# Patient Record
Sex: Male | Born: 1960 | Race: White | Hispanic: No | Marital: Single | Smoking: Current every day smoker
Health system: Southern US, Community
[De-identification: ages and names within clinical notes are randomized; demographics above are authoritative.]

## PROBLEM LIST (undated history)

## (undated) DIAGNOSIS — J439 Emphysema, unspecified: Secondary | ICD-10-CM

## (undated) DIAGNOSIS — W3400XA Accidental discharge from unspecified firearms or gun, initial encounter: Secondary | ICD-10-CM

## (undated) DIAGNOSIS — G8929 Other chronic pain: Secondary | ICD-10-CM

## (undated) DIAGNOSIS — R52 Pain, unspecified: Secondary | ICD-10-CM

## (undated) DIAGNOSIS — F319 Bipolar disorder, unspecified: Secondary | ICD-10-CM

## (undated) DIAGNOSIS — M549 Dorsalgia, unspecified: Secondary | ICD-10-CM

## (undated) HISTORY — PX: BACK SURGERY: SHX140

## (undated) HISTORY — PX: MANDIBLE SURGERY: SHX707

## (undated) HISTORY — PX: DG THUMB LEFT HAND: HXRAD1658

## (undated) HISTORY — DX: Emphysema, unspecified: J43.9

---

## 2009-03-12 ENCOUNTER — Inpatient Hospital Stay: Payer: Self-pay | Admitting: Unknown Physician Specialty

## 2011-03-12 ENCOUNTER — Emergency Department (HOSPITAL_COMMUNITY)
Admission: EM | Admit: 2011-03-12 | Discharge: 2011-03-12 | Disposition: A | Discharge status: Home / Self Care | Payer: Medicaid Other | Attending: Emergency Medicine | Admitting: Emergency Medicine

## 2011-03-12 ENCOUNTER — Encounter: Payer: Self-pay | Admitting: *Deleted

## 2011-03-12 DIAGNOSIS — M545 Low back pain, unspecified: Secondary | ICD-10-CM | POA: Insufficient documentation

## 2011-03-12 DIAGNOSIS — G8929 Other chronic pain: Secondary | ICD-10-CM | POA: Insufficient documentation

## 2011-03-12 DIAGNOSIS — F17200 Nicotine dependence, unspecified, uncomplicated: Secondary | ICD-10-CM | POA: Insufficient documentation

## 2011-03-12 MED ORDER — HYDROCODONE-ACETAMINOPHEN 7.5-325 MG PO TABS: 1.0000 | ORAL_TABLET | ORAL | Status: AC | PRN

## 2011-03-12 MED ORDER — HYDROCODONE-ACETAMINOPHEN 5-325 MG PO TABS
2.0000 | ORAL_TABLET | Freq: Once | ORAL | Status: AC
  Administered 2011-03-12: 2 via ORAL
  Filled 2011-03-12: qty 2

## 2011-03-12 MED ORDER — DEXAMETHASONE SODIUM PHOSPHATE 4 MG/ML IJ SOLN
8.0000 mg | Freq: Once | INTRAMUSCULAR | Status: AC
  Administered 2011-03-12: 8 mg via INTRAMUSCULAR

## 2011-03-12 MED ORDER — DEXAMETHASONE SODIUM PHOSPHATE 4 MG/ML IJ SOLN
8.0000 mg | Freq: Once | INTRAMUSCULAR | Status: DC
  Filled 2011-03-12: qty 2

## 2011-03-12 MED ORDER — DIAZEPAM 5 MG PO TABS
5.0000 mg | ORAL_TABLET | Freq: Once | ORAL | Status: AC
  Administered 2011-03-12: 5 mg via ORAL
  Filled 2011-03-12: qty 1

## 2011-03-12 MED ORDER — DEXAMETHASONE 6 MG PO TABS: 6.0000 mg | ORAL_TABLET | Freq: Two times a day (BID) | ORAL | Status: AC

## 2011-03-12 MED ORDER — METHOCARBAMOL 500 MG PO TABS: ORAL_TABLET | ORAL | Status: DC

## 2011-03-12 NOTE — ED Notes (Signed)
Pt c/o chronic back pain with pain to both sides of his butt and down left leg.

## 2011-03-12 NOTE — ED Notes (Signed)
Pt c/o lower back pain, hx of chronic back pain from back injury at work several years ago, pt's pcp is no longer practicing so pt decided to move to this area to help his sister, pt has been in this area for a week, states that with the move his lower back has "flared up", does not have local pcp and is requesting referral for pcp as well, states that he was seen in [place] er about 8 days ago and given dilaudid pain shot and oxycodone prescription, states that he normally takes endocets 10/325 3-4 a day, states that he would get 120 a month.

## 2011-03-12 NOTE — ED Provider Notes (Signed)
History     CSN: [id number] Arrival date & time: 03/12/2011  5:55 PM  Chief Complaint  Patient presents with  . Back Pain  . pt c/o pain in both sides of butt    HPI Comments: Pt complain of pain and burning from the lower back to both [place]tox and down the left leg. This is not new. Pt sustained injury from a fall at work 6 years ago. He has been seen by a pain specialist in another area. He has recently moved to the [place] area and request assistance with pain and referrals.  Patient is a 50 y.o. male presenting with back pain. The history is provided by the patient.  Back Pain  This is a chronic problem. The problem occurs daily. The problem has not changed since onset.The pain is associated with falling. The pain is present in the lumbar spine. The quality of the pain is described as aching. The pain radiates to the left thigh. The pain is severe. The symptoms are aggravated by certain positions. Pertinent negatives include no chest pain, no abdominal pain, no bowel incontinence, no bladder incontinence and no dysuria. The treatment provided no relief.    History reviewed. No pertinent past medical history.  Past Surgical History  Procedure Date  . Back surgery     History reviewed. No pertinent family history.  History  Substance Use Topics  . Smoking status: Current Everyday Smoker -- 1.0 packs/day  . Smokeless tobacco: Not on file  . Alcohol Use: No      Review of Systems  Constitutional: Negative for activity change.       All ROS Neg except as noted in HPI  HENT: Negative for nosebleeds and neck pain.   Eyes: Negative for photophobia and discharge.  Respiratory: Negative for cough, shortness of breath and wheezing.   Cardiovascular: Negative for chest pain and palpitations.  Gastrointestinal: Negative for abdominal pain, blood in stool and bowel incontinence.  Genitourinary: Negative for bladder incontinence, dysuria, frequency and hematuria.  Musculoskeletal:  Positive for back pain. Negative for arthralgias.  Skin: Negative.   Neurological: Negative for dizziness, seizures and speech difficulty.  Psychiatric/Behavioral: Negative for hallucinations and confusion.    Physical Exam  BP 131/94  Pulse 72  Temp(Src) 98 F (36.7 C) (Oral)  Resp 16  Ht 6\' 1"  (1.854 m)  Wt 155 lb (70.308 kg)  BMI 20.45 kg/m2  SpO2 100%  Physical Exam  Nursing note and vitals reviewed. Constitutional: He is oriented to person, place, and time. He appears well-developed and well-nourished.  Non-toxic appearance.  HENT:  Head: Normocephalic.  Right Ear: Tympanic membrane and external ear normal.  Left Ear: Tympanic membrane and external ear normal.  Eyes: EOM and lids are normal. Pupils are equal, round, and reactive to light.  Neck: Normal range of motion. Neck supple. Carotid bruit is not present.  Cardiovascular: Normal rate, regular rhythm, normal heart sounds, intact distal pulses and normal pulses.   Pulmonary/Chest: Breath sounds normal. No respiratory distress.       Course breath sounds.  Abdominal: Soft. Bowel sounds are normal. There is no tenderness. There is no guarding.  Musculoskeletal: Normal range of motion.       Lower back pain with change of position.  Lymphadenopathy:       Head (right side): No submandibular adenopathy present.       Head (left side): No submandibular adenopathy present.    He has no cervical adenopathy.  Neurological: He is  alert and oriented to person, place, and time. He has normal strength. No cranial nerve deficit or sensory deficit. He exhibits normal muscle tone. Coordination normal.  Skin: Skin is warm and dry.  Psychiatric: He has a normal mood and affect. His speech is normal.    ED Course: Discussed need for referal to the [facility]. PCP can be referred by Medicaid.  Procedures  MDM I have reviewed nursing notes, vital signs, and all appropriate lab and imaging results for this  patient.      Kathie Dike, [place] 03/12/11 [phone]

## 2011-03-12 NOTE — ED Notes (Signed)
Pt a/ox4. Resp even and unlabored. D/C instructions reviewed with pt. Pt verbalized understanding. Pt ambulated to POV with steady gate. 

## 2011-03-12 NOTE — ED Provider Notes (Signed)
Medical screening examination/treatment/procedure(s) were performed by non-physician practitioner and as supervising physician I was immediately available for consultation/collaboration.   Lyanne Co, MD 03/12/11 [phone]

## 2011-05-13 ENCOUNTER — Ambulatory Visit: Payer: Medicaid Other | Admitting: Internal Medicine

## 2012-06-10 ENCOUNTER — Inpatient Hospital Stay: Payer: Self-pay | Admitting: Psychiatry

## 2012-06-10 LAB — URINALYSIS, COMPLETE
Bacteria: NONE SEEN
Bilirubin,UR: NEGATIVE
Glucose,UR: NEGATIVE mg/dL (ref 0–75)
Ketone: NEGATIVE
Nitrite: NEGATIVE
Specific Gravity: 1.01 (ref 1.003–1.030)

## 2012-06-10 LAB — DRUG SCREEN, URINE
Barbiturates, Ur Screen: NEGATIVE (ref ?–200)
Cannabinoid 50 Ng, Ur ~~LOC~~: NEGATIVE (ref ?–50)
Cocaine Metabolite,Ur ~~LOC~~: NEGATIVE (ref ?–300)
Methadone, Ur Screen: NEGATIVE (ref ?–300)
Opiate, Ur Screen: POSITIVE (ref ?–300)
Phencyclidine (PCP) Ur S: NEGATIVE (ref ?–25)
Tricyclic, Ur Screen: NEGATIVE (ref ?–1000)

## 2012-06-10 LAB — CBC
HCT: 45 % (ref 40.0–52.0)
HGB: 15.1 g/dL (ref 13.0–18.0)
MCH: 30.4 pg (ref 26.0–34.0)
MCHC: 33.4 g/dL (ref 32.0–36.0)
MCV: 91 fL (ref 80–100)

## 2012-06-10 LAB — COMPREHENSIVE METABOLIC PANEL
Anion Gap: 6 — ABNORMAL LOW (ref 7–16)
BUN: 10 mg/dL (ref 7–18)
Chloride: 107 mmol/L (ref 98–107)
Creatinine: 1.05 mg/dL (ref 0.60–1.30)
EGFR (Non-African Amer.): >60
Potassium: 3.7 mmol/L (ref 3.5–5.1)
SGPT (ALT): 38 U/L (ref 12–78)

## 2012-06-10 LAB — ACETAMINOPHEN LEVEL: Acetaminophen: <2 ug/mL

## 2012-06-10 LAB — TSH: Thyroid Stimulating Horm: 3.25 u[IU]/mL

## 2014-07-09 DIAGNOSIS — F17200 Nicotine dependence, unspecified, uncomplicated: Secondary | ICD-10-CM | POA: Insufficient documentation

## 2014-07-09 DIAGNOSIS — F1021 Alcohol dependence, in remission: Secondary | ICD-10-CM | POA: Insufficient documentation

## 2014-10-24 NOTE — Discharge Summary (Signed)
PATIENT NAME:  Jonathan Salazar, Jonathan Salazar MR#:  [id number] DATE OF BIRTH:  11-Jun-1961  DATE OF ADMISSION:  06/10/2012 DATE OF DISCHARGE:  06/13/2012  HOSPITAL COURSE: See dictated history and physical for details of admission. This 54 year old man with a long history of mood instability and anger problems as well as chronic pain came to the Emergency Room making suicidal statements. It was not clear what if anything had changed that made him feel so much worse. He gives a history of having long-standing problems with anger and rage as well as depression. He was admitted voluntarily to the hospital. In the hospital, he was initially withdrawn from his narcotic pain medicine because of his unclear history of reason for needing such high narcotic medication. I did, however, after speaking with the patient speak with his outpatient chronic pain doctor who confirms that the patient will continue to receive standing narcotics through the orthopedic clinic in [place]. Given this the patient was continued on his Percocet 10 mg strength five of them a day that he was taking outpatient. He was also continued on 1.5 mg of clonazepam that apparently Dr. Janeece Riggers continues to prescribe for him. Despite being back on these medicines patient continued to complain of mood lability. I suggested to him that with his history of chronic anger, depression and mood lability and difficulty sleeping, that we try Seroquel. He was agreeable and has been dosed with Seroquel initially 100 mg at night and then 200 mg at night. It has helped him sleep a little bit and perhaps has been of some help with his mood but he still continues to be impulsive, angry, and depressed. Patient signed a 72 hour release form which will reach its full term as of this evening. I spoke with the patient today and told him that my advice would be that he cancel that request and stay in the hospital longer. I informed him that I would like to try to get the Seroquel up to a  reasonable dose and see if we could help him with his anger and irritability. Patient expressed an understanding of that but continues to insist that he wants to be discharged from the hospital. He has been educated about the increased risk of suicide, violence, harm to others and generally poor outcomes including further legal and social problems if he does not get his anger and depression under control. He was educated that medication and therapy could be of help if he were to be compliant with them and make an effort to try and improve this. He expresses an understanding of all of this but still says that he wants to be discharged today. He can go back to stay at his sisters. He says that he will follow up with Dr. Janeece Riggers. Patient absolutely denies that he is suicidal currently and has not attempted to harm himself in the hospital. He has expressed optimism about getting his disability and has things to live for including trying to get himself living independently once he gets his disability. He denies any wish to die. He also has not been physically violent towards others in the hospital and denies any actual intention of doing anything violent or aggressive to others. At this point, the patient appears to be able to make informed decisions about his treatment. He does not meet acute commitment criteria. Because of this he will be discharged although it is against my advice. He will not be given any new prescriptions. He needs to follow up with his pain  doctor and also follow up with Dr. Janeece RiggersSu. He was given very strong advice that he do that at the earliest opportunity. He is aware that he can return to the Emergency Room if things become dangerous or he needs emergency treatment again.   DISCHARGE MEDICATIONS: No new prescriptions will be given. Patient will continue on:  1. Percocet 10 mg strength five a day through his primary care pain doctor. 2. Gabapentin 600 mg 3 times a day through his pain  doctor. 3. Klonopin 1.5 mg at bedtime through Dr. Janeece RiggersSu. 4. Flexeril 10 mg q.8 hours p.r.n. through his pain doctor.   LABORATORY, DIAGNOSTIC AND RADIOLOGICAL DATA: EKG was entirely normal. Chemistry panel on admission normal. Alcohol undetected. CBC normal. TSH normal. Drug screen positive for opiates. Urinalysis unremarkable. Head CT normal. Chest x-ray normal.   MENTAL STATUS EXAM: Somewhat disheveled man who looks his stated age. Cooperative with the interview. Good eye contact. Psychomotor activity normal. Affect still irritable and dysphoric but reactive. Mood stated as being okay. Thoughts tend to focus strongly on his grievances of things that have happened in the past but are not grossly loose or disorganized and he is able to hold a lucid conversation. Denies auditory or visual hallucinations. Denies suicidal or homicidal ideation. Judgment and insight perhaps not up to the highest standard but he does appear to be able to make informed decisions. Baseline intelligence average. Alert and oriented x4.   DIAGNOSIS PRINCIPLE AND PRIMARY:  AXIS I: Depression, not otherwise specified, rule out bipolar disorder, not otherwise specified.   SECONDARY DIAGNOSES:  AXIS I: Deferred.   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   AXIS III: Chronic pain.   AXIS IV: Moderate to severe from minimal social support, no financial support to speak of.     AXIS V: Functioning at time of discharge 50.  ____________________________ Jonathan AmelJohn T. Harding Thomure, MD jtc:cms D: 06/13/2012 12:41:20 ET T: 06/13/2012 13:12:51 ET JOB#: [id number]  cc: Jonathan AmelJohn T. Jadie Allington, MD, <Dictator> Jonathan AmelJOHN T Sallee Hogrefe MD ELECTRONICALLY SIGNED 06/14/2012 9:45

## 2014-10-24 NOTE — H&P (Signed)
  Audery AmelJOHN T Loeta Herst MD ELECTRONICALLY SIGNED 06/14/2012 9:45
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
## 2016-09-22 ENCOUNTER — Emergency Department (HOSPITAL_COMMUNITY)
Admission: EM | Admit: 2016-09-22 | Discharge: 2016-09-22 | Disposition: A | Discharge status: Home / Self Care | Payer: Medicaid Other | Attending: Emergency Medicine | Admitting: Emergency Medicine

## 2016-09-22 ENCOUNTER — Encounter (HOSPITAL_COMMUNITY): Payer: Self-pay

## 2016-09-22 DIAGNOSIS — F17210 Nicotine dependence, cigarettes, uncomplicated: Secondary | ICD-10-CM | POA: Insufficient documentation

## 2016-09-22 DIAGNOSIS — M5441 Lumbago with sciatica, right side: Secondary | ICD-10-CM | POA: Diagnosis not present

## 2016-09-22 DIAGNOSIS — G8929 Other chronic pain: Secondary | ICD-10-CM | POA: Insufficient documentation

## 2016-09-22 DIAGNOSIS — F1193 Opioid use, unspecified with withdrawal: Secondary | ICD-10-CM

## 2016-09-22 DIAGNOSIS — M5442 Lumbago with sciatica, left side: Secondary | ICD-10-CM | POA: Insufficient documentation

## 2016-09-22 DIAGNOSIS — M545 Low back pain: Secondary | ICD-10-CM | POA: Diagnosis present

## 2016-09-22 DIAGNOSIS — F1123 Opioid dependence with withdrawal: Secondary | ICD-10-CM | POA: Diagnosis not present

## 2016-09-22 HISTORY — DX: Bipolar disorder, unspecified: F31.9

## 2016-09-22 HISTORY — DX: Accidental discharge from unspecified firearms or gun, initial encounter: W34.00XA

## 2016-09-22 HISTORY — DX: Dorsalgia, unspecified: M54.9

## 2016-09-22 HISTORY — DX: Other chronic pain: G89.29

## 2016-09-22 MED ORDER — HYDROMORPHONE HCL 2 MG/ML IJ SOLN
2.0000 mg | Freq: Once | INTRAMUSCULAR | Status: AC
  Administered 2016-09-22: 2 mg via INTRAMUSCULAR
  Filled 2016-09-22: qty 1

## 2016-09-22 MED ORDER — OXYCODONE-ACETAMINOPHEN 5-325 MG PO TABS: 2.0000 | ORAL_TABLET | Freq: Four times a day (QID) | ORAL | 0 refills | Status: DC | PRN

## 2016-09-22 MED ORDER — PREDNISONE 50 MG PO TABS
60.0000 mg | ORAL_TABLET | Freq: Once | ORAL | Status: AC
  Administered 2016-09-22: 60 mg via ORAL
  Filled 2016-09-22: qty 1

## 2016-09-22 MED ORDER — PREDNISONE 10 MG PO TABS
ORAL_TABLET | ORAL | 0 refills | Status: DC
Start: 2016-09-23 — End: 2017-04-07

## 2016-09-22 NOTE — ED Notes (Signed)
Pt upset, loud wanting something for chronic back pain. Pt states his pain MD for the past 6 years has stopped seeing him. Pt informed limited to what can be done in the ER. & he was going to have to establish another pain MD for long term care.

## 2016-09-22 NOTE — Discharge Instructions (Signed)
Take your next dose of prednisone tomorrow evening.  Use the the other medicines as directed.  Do not drive within 4 hours of taking oxycodone as this will make you drowsy.  Avoid lifting,  Bending,  Twisting or any other activity that worsens your pain over the next week.    You should get rechecked if your symptoms are not better over the next 5 days,  Or you develop increased pain,  Weakness in your leg(s) or loss of bladder or bowel function - these are symptoms of a worse injury.
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
## 2016-09-22 NOTE — ED Notes (Signed)
Pt alert & oriented x4, stable gait. Patient given discharge instructions, paperwork & prescription(s). Patient  instructed to stop at the registration desk to finish any additional paperwork. Patient verbalized understanding. Pt left department w/ no further questions. 

## 2016-11-22 ENCOUNTER — Encounter (HOSPITAL_COMMUNITY): Payer: Self-pay | Admitting: *Deleted

## 2016-11-22 ENCOUNTER — Emergency Department (HOSPITAL_COMMUNITY)
Admission: EM | Admit: 2016-11-22 | Discharge: 2016-11-22 | Disposition: A | Discharge status: Home / Self Care | Payer: Medicaid Other | Attending: Emergency Medicine | Admitting: Emergency Medicine

## 2016-11-22 DIAGNOSIS — G8929 Other chronic pain: Secondary | ICD-10-CM | POA: Insufficient documentation

## 2016-11-22 DIAGNOSIS — M5442 Lumbago with sciatica, left side: Secondary | ICD-10-CM | POA: Insufficient documentation

## 2016-11-22 DIAGNOSIS — F17210 Nicotine dependence, cigarettes, uncomplicated: Secondary | ICD-10-CM | POA: Diagnosis not present

## 2016-11-22 DIAGNOSIS — M5441 Lumbago with sciatica, right side: Secondary | ICD-10-CM | POA: Diagnosis not present

## 2016-11-22 DIAGNOSIS — M545 Low back pain: Secondary | ICD-10-CM | POA: Diagnosis present

## 2016-11-22 MED ORDER — KETOROLAC TROMETHAMINE 60 MG/2ML IM SOLN
30.0000 mg | Freq: Once | INTRAMUSCULAR | Status: AC
  Administered 2016-11-22: 30 mg via INTRAMUSCULAR
  Filled 2016-11-22: qty 2

## 2016-11-22 NOTE — ED Triage Notes (Signed)
Low back pain that radiates down both legs.

## 2016-11-22 NOTE — Discharge Instructions (Signed)
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
## 2017-01-16 ENCOUNTER — Emergency Department (HOSPITAL_COMMUNITY): Payer: Medicaid Other

## 2017-01-16 ENCOUNTER — Encounter (HOSPITAL_COMMUNITY): Payer: Self-pay | Admitting: Emergency Medicine

## 2017-01-16 ENCOUNTER — Emergency Department (HOSPITAL_COMMUNITY)
Admission: EM | Admit: 2017-01-16 | Discharge: 2017-01-16 | Disposition: A | Discharge status: Home / Self Care | Payer: Medicaid Other | Attending: Emergency Medicine | Admitting: Emergency Medicine

## 2017-01-16 DIAGNOSIS — W19XXXA Unspecified fall, initial encounter: Secondary | ICD-10-CM | POA: Diagnosis not present

## 2017-01-16 DIAGNOSIS — G8929 Other chronic pain: Secondary | ICD-10-CM

## 2017-01-16 DIAGNOSIS — M5440 Lumbago with sciatica, unspecified side: Secondary | ICD-10-CM | POA: Diagnosis not present

## 2017-01-16 DIAGNOSIS — Y939 Activity, unspecified: Secondary | ICD-10-CM | POA: Insufficient documentation

## 2017-01-16 DIAGNOSIS — Y999 Unspecified external cause status: Secondary | ICD-10-CM | POA: Diagnosis not present

## 2017-01-16 DIAGNOSIS — M545 Low back pain: Secondary | ICD-10-CM | POA: Diagnosis present

## 2017-01-16 DIAGNOSIS — Y929 Unspecified place or not applicable: Secondary | ICD-10-CM | POA: Insufficient documentation

## 2017-01-16 DIAGNOSIS — F17210 Nicotine dependence, cigarettes, uncomplicated: Secondary | ICD-10-CM | POA: Diagnosis not present

## 2017-01-16 HISTORY — DX: Pain, unspecified: R52

## 2017-01-16 MED ORDER — METHYLPREDNISOLONE SODIUM SUCC 125 MG IJ SOLR
125.0000 mg | Freq: Once | INTRAMUSCULAR | Status: AC
  Administered 2017-01-16: 125 mg via INTRAMUSCULAR
  Filled 2017-01-16: qty 2

## 2017-01-16 MED ORDER — ONDANSETRON HCL 4 MG PO TABS
4.0000 mg | ORAL_TABLET | Freq: Once | ORAL | Status: AC
  Administered 2017-01-16: 4 mg via ORAL
  Filled 2017-01-16: qty 1

## 2017-01-16 MED ORDER — MORPHINE SULFATE (PF) 4 MG/ML IV SOLN
4.0000 mg | Freq: Once | INTRAVENOUS | Status: AC
  Administered 2017-01-16: 4 mg via INTRAMUSCULAR
  Filled 2017-01-16: qty 1

## 2017-01-16 MED ORDER — DIAZEPAM 5 MG PO TABS
10.0000 mg | ORAL_TABLET | Freq: Once | ORAL | Status: AC
  Administered 2017-01-16: 10 mg via ORAL
  Filled 2017-01-16: qty 2
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
## 2017-01-16 NOTE — Discharge Instructions (Signed)
Your vital signs within normal limits. Your oxygen level is 99% on room air. No acute neurologic deficit appreciated on examination at this time. You were treated tonight with a narcotic pain medication and muscle relaxer as well as a steroid. Please use caution getting around. Please see the physicians at the [facility], and or one of the pain management centers that you were given by Dr. Pernell DupreAdams for any additional pain management needs. The emergency department is not set up to assist with pain management.

## 2017-01-16 NOTE — ED Notes (Signed)
Pt appears very agitated and upset.  He states that he falls "all the time" and is in severe pain all the time.  He states that he falls when he takes showers, he can't do anything for himself without falling and is in severe pain.  He states that the pharmacy he was using began charging him $300 a month for his meds and he became very upset with them and when he voiced that frustration to the office of his prescribing MD and asked for him to send the prescription to another pharmacy he was discharged from the practice.  Pt also states that he can't do a pain clinic because he can't wait for 4 hours sitting in a chair due to the pain.  Pt states that he hardly sleeps.  Pt reports this is an existing condition from when he broke his back.  Pt states that he is here "for a shot to stop the burning"

## 2017-01-27 DIAGNOSIS — M5442 Lumbago with sciatica, left side: Secondary | ICD-10-CM

## 2017-01-27 DIAGNOSIS — G8929 Other chronic pain: Secondary | ICD-10-CM | POA: Insufficient documentation

## 2017-01-27 DIAGNOSIS — M5441 Lumbago with sciatica, right side: Secondary | ICD-10-CM

## 2017-04-07 ENCOUNTER — Encounter: Payer: Self-pay | Admitting: Nurse Practitioner

## 2017-04-07 ENCOUNTER — Ambulatory Visit
Admission: RE | Admit: 2017-04-07 | Discharge: 2017-04-07 | Disposition: A | Discharge status: Home / Self Care | Payer: Medicaid Other | Source: Ambulatory Visit | Attending: Nurse Practitioner | Admitting: Nurse Practitioner

## 2017-04-07 ENCOUNTER — Ambulatory Visit: Payer: Medicaid Other | Attending: Nurse Practitioner | Admitting: Nurse Practitioner

## 2017-04-07 VITALS — BP 113/77 | HR 66 | Temp 97.8°F | Resp 18 | Ht 73.0 in | Wt 172.0 lb

## 2017-04-07 DIAGNOSIS — M5441 Lumbago with sciatica, right side: Secondary | ICD-10-CM

## 2017-04-07 DIAGNOSIS — M79604 Pain in right leg: Secondary | ICD-10-CM

## 2017-04-07 DIAGNOSIS — F17210 Nicotine dependence, cigarettes, uncomplicated: Secondary | ICD-10-CM | POA: Diagnosis not present

## 2017-04-07 DIAGNOSIS — M79605 Pain in left leg: Secondary | ICD-10-CM

## 2017-04-07 DIAGNOSIS — M25552 Pain in left hip: Secondary | ICD-10-CM | POA: Diagnosis not present

## 2017-04-07 DIAGNOSIS — M5442 Lumbago with sciatica, left side: Secondary | ICD-10-CM

## 2017-04-07 DIAGNOSIS — G8929 Other chronic pain: Secondary | ICD-10-CM

## 2017-04-07 DIAGNOSIS — F319 Bipolar disorder, unspecified: Secondary | ICD-10-CM | POA: Diagnosis not present

## 2017-04-07 DIAGNOSIS — J439 Emphysema, unspecified: Secondary | ICD-10-CM | POA: Insufficient documentation

## 2017-04-07 DIAGNOSIS — M25551 Pain in right hip: Secondary | ICD-10-CM | POA: Diagnosis not present

## 2017-04-07 DIAGNOSIS — G894 Chronic pain syndrome: Secondary | ICD-10-CM | POA: Diagnosis not present

## 2017-04-07 DIAGNOSIS — M899 Disorder of bone, unspecified: Secondary | ICD-10-CM | POA: Diagnosis not present

## 2017-04-07 DIAGNOSIS — Z79891 Long term (current) use of opiate analgesic: Secondary | ICD-10-CM | POA: Diagnosis not present

## 2017-04-07 DIAGNOSIS — M533 Sacrococcygeal disorders, not elsewhere classified: Secondary | ICD-10-CM

## 2017-04-07 DIAGNOSIS — M5136 Other intervertebral disc degeneration, lumbar region: Secondary | ICD-10-CM | POA: Diagnosis not present

## 2017-04-07 DIAGNOSIS — M7918 Myalgia, other site: Secondary | ICD-10-CM | POA: Diagnosis not present

## 2017-04-07 DIAGNOSIS — I700 Atherosclerosis of aorta: Secondary | ICD-10-CM | POA: Diagnosis not present

## 2017-04-07 DIAGNOSIS — M545 Low back pain: Secondary | ICD-10-CM | POA: Diagnosis present

## 2017-04-07 DIAGNOSIS — Z789 Other specified health status: Secondary | ICD-10-CM | POA: Diagnosis not present

## 2017-04-07 DIAGNOSIS — Z7409 Other reduced mobility: Secondary | ICD-10-CM | POA: Insufficient documentation

## 2017-04-07 NOTE — Patient Instructions (Signed)

## 2017-04-07 NOTE — Progress Notes (Signed)
Safety precautions to be maintained throughout the outpatient stay will include: orient to surroundings, keep bed in low position, maintain call bell within reach at all times, provide assistance with transfer out of bed and ambulation.  

## 2017-04-07 NOTE — Progress Notes (Addendum)
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 more than 24 hours prior to the originally scheduled appointment. This allows for the appointment to be offered to other patients. 9. Rescheduling:  When a visit is rescheduled, it will require the cancellation of the original appointment. For this reason they both fall within the category of "Cancellations".  10. Cancellations:  They require advanced notification. Any cancellation less than 24 hours before the  appointment will be recorded as a "No Show". 11. No Show:  Defined as an unkept appointment where the patient failed to notify or declare to the practice their intention or inability to keep the appointment.  Corrective process for repeat offenders:  1. Tardiness: Three (3) episodes of rescheduling due to late arrivals will be recorded as one (1) "No Show". 2. Cancellation or reschedule: Three (3) cancellations or rescheduling will be recorded as one (1) "No Show". 3. "No Shows": Three (3) "No Shows" within a 12 month period will  result in discharge from the practice.  ____________________________________________________________________________________________  ____________________________________________________________________________________________  Pain Scale  Introduction: The pain score used by this practice is the Verbal Numerical Rating Scale (VNRS-11). This is an 11-point scale. It is for adults and children 10 years or older. There are significant differences in how the pain score is reported, used, and applied. Forget everything you learned in the past and learn this scoring system.  General Information: The scale should reflect your current level of pain. Unless you are specifically asked for the level of your worst pain, or your average pain. If you are asked for one of these two, then it should be understood that it is over the past 24 hours.  Basic Activities of Daily Living (ADL): Personal hygiene, dressing, eating, transferring, and using restroom.  Instructions: Most patients tend to report their level of pain as a combination of two factors, their physical pain and their psychosocial pain. This last one is also known as "suffering" and it is reflection of how physical pain affects you socially and psychologically. From now on, report them separately. From this point on, when asked to report your pain level, report only your physical pain. Use the following table for reference.  Pain Clinic Pain Levels (0-5/10)  Pain Level Score  Description  No Pain 0   Mild pain 1 Nagging, annoying, but does not interfere with basic activities of daily living (ADL). Patients are able to eat, bathe, get dressed, toileting (being able to get on and off the toilet and perform personal hygiene functions), transfer (move in and out of bed or a chair without assistance), and maintain continence (able to control bladder and bowel functions). Blood pressure and heart rate are unaffected. A normal heart rate for a healthy adult  ranges from 60 to 100 bpm (beats per minute).   Mild to moderate pain 2 Noticeable and distracting. Impossible to hide from other people. More frequent flare-ups. Still possible to adapt and function close to normal. It can be very annoying and may have occasional stronger flare-ups. With discipline, patients may get used to it and adapt.   Moderate pain 3 Interferes significantly with activities of daily living (ADL). It becomes difficult to feed, bathe, get dressed, get on and off the toilet or to perform personal hygiene functions. Difficult to get in and out of bed or a chair without assistance. Very distracting. With effort, it can be ignored when deeply involved in activities.   Moderately severe pain 4 Impossible to ignore for more than a few minutes. With effort, patients may still be able to manage work or participate in some social activities. Very difficult to concentrate. Signs of autonomic nervous system discharge are evident: dilated pupils (mydriasis); mild sweating (diaphoresis); sleep interference. Heart rate becomes elevated (>115 bpm). Diastolic blood pressure (lower number) rises above 100 mmHg. Patients find relief in laying down and not moving.   Severe pain 5 Intense and extremely unpleasant. Associated with frowning face and frequent crying. Pain overwhelms the senses.  Ability to do any activity or maintain social relationships becomes significantly limited. Conversation becomes difficult. Pacing back and forth is common, as getting into a comfortable position is nearly impossible. Pain wakes you up from deep sleep. Physical signs will be obvious: pupillary dilation; increased sweating; goosebumps; brisk reflexes; cold, clammy hands and feet; nausea, vomiting or dry heaves; loss of appetite; significant sleep disturbance with inability to fall asleep or to remain asleep. When persistent, significant weight loss is observed due to the complete loss of appetite and sleep deprivation.   Blood pressure  and heart rate becomes significantly elevated. Caution: If elevated blood pressure triggers a pounding headache associated with blurred vision, then the patient should immediately seek attention at an urgent or emergency care unit, as these may be signs of an impending stroke.    Emergency Department Pain Levels (6-10/10)  Emergency Room Pain 6 Severely limiting. Requires emergency care and should not be seen or managed at an outpatient pain management facility. Communication becomes difficult and requires great effort. Assistance to reach the emergency department may be required. Facial flushing and profuse sweating along with potentially dangerous increases in heart rate and blood pressure will be evident.   Distressing pain 7 Self-care is very difficult. Assistance is required to transport, or use restroom. Assistance to reach the emergency department will be required. Tasks requiring coordination, such as bathing and getting dressed become very difficult.   Disabling pain 8 Self-care is no longer possible. At this level, pain is disabling. The individual is unable to do even the most "basic" activities such as walking, eating, bathing, dressing, transferring to a bed, or toileting. Fine motor skills are lost. It is difficult to think clearly.   Incapacitating pain 9 Pain becomes incapacitating. Thought processing is no longer possible. Difficult to remember your own name. Control of movement and coordination are lost.   The worst pain imaginable 10 At this level, most patients pass out from pain. When this level is reached, collapse of the autonomic nervous system occurs, leading to a sudden drop in blood pressure and heart rate. This in turn results in a temporary and dramatic drop in blood flow to the brain, leading to a loss of consciousness. Fainting is one of the body's self defense mechanisms. Passing out puts the brain in a calmed state and causes it to shut down for a while,  in order to begin the healing process.    Summary: 1. Refer to this scale when providing [place] with your pain level. 2. Be accurate and careful when reporting your pain level. This will help with your care. 3. Over-reporting your pain level will lead to loss of credibility. 4. Even a level of 1/10 means that there is pain and will be treated at our facility. 5. High, inaccurate reporting will be documented as "Symptom Exaggeration", leading to loss of credibility and suspicions of possible secondary gains such as obtaining more narcotics, or wanting to appear disabled, for fraudulent reasons. 6. Only pain levels of 5 or below will be seen at our facility. 7. Pain levels of 6 and above will be sent to the Emergency Department and the appointment cancelled. ____________________________________________________________________________________________

## 2017-04-08 ENCOUNTER — Encounter: Payer: Self-pay | Admitting: Nurse Practitioner

## 2017-04-08 DIAGNOSIS — M7918 Myalgia, other site: Secondary | ICD-10-CM | POA: Insufficient documentation

## 2017-04-08 DIAGNOSIS — G8929 Other chronic pain: Secondary | ICD-10-CM

## 2017-04-09 NOTE — Progress Notes (Signed)
Results were reviewed and found to be: mildly abnormal  No acute injury or pathology identified  Review would suggest interventional pain management techniques may be of benefit 

## 2017-04-10 LAB — 25-HYDROXY VITAMIN D LCMS D2+D3: 25-Hydroxy, Vitamin D: 35 ng/mL

## 2017-04-10 LAB — COMP. METABOLIC PANEL (12)
ALBUMIN: 4.6 g/dL (ref 3.5–5.5)
AST: 29 IU/L (ref 0–40)
Albumin/Globulin Ratio: 1.5 (ref 1.2–2.2)
Alkaline Phosphatase: 68 IU/L (ref 39–117)
BUN/Creatinine Ratio: 14 (ref 9–20)
BUN: 17 mg/dL (ref 6–24)
Bilirubin Total: 0.5 mg/dL (ref 0.0–1.2)
CALCIUM: 10.3 mg/dL — AB (ref 8.7–10.2)
CREATININE: 1.24 mg/dL (ref 0.76–1.27)
Chloride: 100 mmol/L (ref 96–106)
GFR, EST AFRICAN AMERICAN: 74 mL/min/{1.73_m2} (ref >59)
GFR, EST NON AFRICAN AMERICAN: 64 mL/min/{1.73_m2} (ref >59)
GLUCOSE: 98 mg/dL (ref 65–99)
Globulin, Total: 3 g/dL (ref 1.5–4.5)
Potassium: 5 mmol/L (ref 3.5–5.2)
SODIUM: 140 mmol/L (ref 134–144)
Total Protein: 7.6 g/dL (ref 6.0–8.5)

## 2017-04-10 LAB — 25-HYDROXYVITAMIN D LCMS D2+D3: 25-HYDROXY, VITAMIN D-3: 35 ng/mL

## 2017-04-10 LAB — C-REACTIVE PROTEIN: CRP: 14.4 mg/L — ABNORMAL HIGH (ref 0.0–4.9)

## 2017-04-10 LAB — VITAMIN B12: VITAMIN B 12: 487 pg/mL (ref 232–1245)

## 2017-04-10 LAB — MAGNESIUM: MAGNESIUM: 2.1 mg/dL (ref 1.6–2.3)

## 2017-04-10 LAB — SEDIMENTATION RATE: SED RATE: 2 mm/h (ref 0–30)

## 2017-04-12 LAB — COMPLIANCE DRUG ANALYSIS, UR

## 2017-04-20 ENCOUNTER — Other Ambulatory Visit: Payer: Self-pay

## 2017-05-12 ENCOUNTER — Emergency Department (HOSPITAL_COMMUNITY)
Admission: EM | Admit: 2017-05-12 | Discharge: 2017-05-12 | Disposition: A | Discharge status: Home / Self Care | Payer: Medicaid Other | Attending: Emergency Medicine | Admitting: Emergency Medicine

## 2017-05-12 ENCOUNTER — Encounter: Payer: Self-pay | Admitting: Emergency Medicine

## 2017-05-12 ENCOUNTER — Emergency Department
Admission: EM | Admit: 2017-05-12 | Discharge: 2017-05-12 | Disposition: A | Discharge status: Home / Self Care | Payer: Medicaid Other | Attending: Emergency Medicine | Admitting: Emergency Medicine

## 2017-05-12 ENCOUNTER — Encounter (HOSPITAL_COMMUNITY): Payer: Self-pay | Admitting: *Deleted

## 2017-05-12 ENCOUNTER — Other Ambulatory Visit: Payer: Self-pay

## 2017-05-12 ENCOUNTER — Emergency Department (HOSPITAL_COMMUNITY)
Admission: EM | Admit: 2017-05-12 | Discharge: 2017-05-12 | Disposition: A | Discharge status: Home / Self Care | Payer: Medicaid Other | Source: Home / Self Care | Attending: Emergency Medicine | Admitting: Emergency Medicine

## 2017-05-12 DIAGNOSIS — G8929 Other chronic pain: Secondary | ICD-10-CM

## 2017-05-12 DIAGNOSIS — Z7289 Other problems related to lifestyle: Secondary | ICD-10-CM | POA: Insufficient documentation

## 2017-05-12 DIAGNOSIS — M5441 Lumbago with sciatica, right side: Principal | ICD-10-CM

## 2017-05-12 DIAGNOSIS — F17210 Nicotine dependence, cigarettes, uncomplicated: Secondary | ICD-10-CM | POA: Insufficient documentation

## 2017-05-12 DIAGNOSIS — R4589 Other symptoms and signs involving emotional state: Secondary | ICD-10-CM | POA: Insufficient documentation

## 2017-05-12 DIAGNOSIS — Z79899 Other long term (current) drug therapy: Secondary | ICD-10-CM | POA: Diagnosis not present

## 2017-05-12 DIAGNOSIS — R4689 Other symptoms and signs involving appearance and behavior: Secondary | ICD-10-CM | POA: Diagnosis not present

## 2017-05-12 DIAGNOSIS — Z765 Malingerer [conscious simulation]: Secondary | ICD-10-CM

## 2017-05-12 DIAGNOSIS — M549 Dorsalgia, unspecified: Secondary | ICD-10-CM | POA: Insufficient documentation

## 2017-05-12 DIAGNOSIS — M5442 Lumbago with sciatica, left side: Secondary | ICD-10-CM | POA: Insufficient documentation

## 2017-05-12 DIAGNOSIS — F1120 Opioid dependence, uncomplicated: Secondary | ICD-10-CM | POA: Insufficient documentation

## 2017-05-12 MED ORDER — CYCLOBENZAPRINE HCL 10 MG PO TABS: 10.0000 mg | ORAL_TABLET | Freq: Three times a day (TID) | ORAL | 0 refills | Status: DC | PRN

## 2017-05-12 MED ORDER — DEXAMETHASONE SODIUM PHOSPHATE 10 MG/ML IJ SOLN
10.0000 mg | Freq: Once | INTRAMUSCULAR | Status: AC
  Administered 2017-05-12: 10 mg via INTRAMUSCULAR
  Filled 2017-05-12: qty 1

## 2017-05-12 MED ORDER — KETOROLAC TROMETHAMINE 60 MG/2ML IM SOLN
60.0000 mg | Freq: Once | INTRAMUSCULAR | Status: AC
  Administered 2017-05-12: 60 mg via INTRAMUSCULAR
  Filled 2017-05-12: qty 2

## 2017-05-12 MED ORDER — DIAZEPAM 5 MG PO TABS
10.0000 mg | ORAL_TABLET | Freq: Once | ORAL | Status: AC
  Administered 2017-05-12: 10 mg via ORAL
  Filled 2017-05-12: qty 2

## 2017-05-12 MED ORDER — TRAMADOL HCL 50 MG PO TABS
50.0000 mg | ORAL_TABLET | Freq: Once | ORAL | Status: AC
  Administered 2017-05-12: 50 mg via ORAL
  Filled 2017-05-12: qty 1

## 2017-05-12 MED ORDER — NAPROXEN 500 MG PO TABS: ORAL_TABLET | ORAL | 0 refills | Status: DC

## 2017-05-12 NOTE — ED Notes (Signed)
[employer] escorting pt off primses at this time , pt loud and cursing.

## 2017-05-12 NOTE — ED Notes (Signed)
Pt rang out to nurses station again. This RN went into the pt's room and advised pt that the EDP had signed up for him and would be in with him shortly. Pt cursing about the burning in his buttocks and how he was in severe pain and that he needs to be seen now.
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
## 2017-05-12 NOTE — Discharge Instructions (Signed)
You need to see your doctor to get back on your chronic pain medication.  The ED does not supply that.  Use ice and heat for comfort.  Take the medications as prescribed.  Chronic Pain Discharge Instructions  Emergency care providers appreciate that many patients coming to us are in severe pain and we wish to address their pain in the safest, most responsible manner.  It is important to recognize however, that the proper treatment of chronic pain differs from that of the pain of injuries and acute illnesses.  Our goal is to provide quality, safe, personalized care and we thank you for giving us the opportunity to serve you. The use of narcotics and related agents for chronic pain syndromes may lead to additional physical and psychological problems.  Nearly as many people die from prescription narcotics each year as die from car crashes.  Additionally, this risk is increased if such prescriptions are obtained from a variety of sources.  Therefore, only your primary care physician or a pain management specialist is able to safely treat such syndromes with narcotic medications long-term.    Documentation revealing such prescriptions have been sought from multiple sources may prohibit us from providing a refill or different narcotic medication.  Your name may be checked first through the [facility] [place] Controlled Substances Reporting System.  This database is a record of controlled substance medication prescriptions that the patient has received.  This has been established by [facility] in an effort to eliminate the dangerous, and often life threatening, practice of obtaining multiple prescriptions from different medical providers.   If you have a chronic pain syndrome (i.e. chronic headaches, recurrent back or neck pain, dental pain, abdominal or pelvis pain without a specific diagnosis, or neuropathic pain such as fibromyalgia) or recurrent visits for the same condition without an acute diagnosis, you may  be treated with non-narcotics and other non-addictive medicines.  Allergic reactions or negative side effects that may be reported by a patient to such medications will not typically lead to the use of a narcotic analgesic or other controlled substance as an alternative.   Patients managing chronic pain with a personal physician should have provisions in place for breakthrough pain.  If you are in crisis, you should call your physician.  If your physician directs you to the emergency department, please have the doctor call and speak to our attending physician concerning your care.   When patients come to the Emergency Department (ED) with acute medical conditions in which the Emergency Department physician feels appropriate to prescribe narcotic or sedating pain medication, the physician will prescribe these in very limited quantities.  The amount of these medications will last only until you can see your primary care physician in his/her office.  Any patient who returns to the ED seeking refills should expect only non-narcotic pain medications.   In the event of an acute medical condition exists and the emergency physician feels it is necessary that the patient be given a narcotic or sedating medication -  a responsible adult driver should be present in the room prior to the medication being given by the nurse.   Prescriptions for narcotic or sedating medications that have been lost, stolen or expired will not be refilled in the Emergency Department.    Patients who have chronic pain may receive non-narcotic prescriptions until seen by their primary care physician.  It is every patient?s personal responsibility to maintain active prescriptions with his or her primary care physician or specialist.

## 2017-05-12 NOTE — ED Notes (Signed)
EDP in with pt. Pt heard cursing from nurses station. This RN went into pt's room with EDP and security at doorway. Pt continues to be loud and curse.

## 2017-05-12 NOTE — ED Notes (Signed)
Unable to obtain discharge vitals, give paperwork or obtain signature due to aggressive patient behavior. Patient escorted to vehicle in wheelchair by [employer] and [employer].

## 2017-05-12 NOTE — ED Notes (Signed)
Lady with pt came out and asked this RN asking for pain medicine for this pt. This person made aware once again that the EDP would have to order meds and she will be in to see pt.
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
## 2017-05-12 NOTE — ED Triage Notes (Signed)
Pt comes in with bilateral lower back pain that moves into his buttocks. [place] when he began complaining of pain. Per EMS, pt was seen here last night.    EDP at bedside

## 2017-05-12 NOTE — ED Notes (Signed)
EDP and this nurse at the bedside. EDP made pt aware that he got pain medication last night. Pt states he usually gets morphine or dilaudid. Pt continually loud while talking with providers. Security at bedside

## 2017-05-12 NOTE — ED Notes (Signed)
Pt states he is in excruciating pain. Pt using foul language and is being loud. This RN advised pt to watch his language and to calm his voice. Pt requesting pain medicine now. Pt advised by this RN that the EDP would be in here as soon as she could. Pt advised that this RN could not give him pain meds until he is seen by the EDP. Pt stating, "all I want is a shot and to go home." "I don't want a prescription for pain medicine." Pt again advised taht the EDP would be in his room shortly.

## 2017-05-12 NOTE — ED Triage Notes (Signed)
Patient presents to ED via POV from home with c/o sciatia pain x 17 years. When asked why patient is here today, patient states "I havent been able to sleep in 4 days". Patient noted to have back brace on.

## 2017-05-12 NOTE — ED Triage Notes (Signed)
Pt states he woke up around 3am with severe back pain that caused him to be unable to catch his breath; pt states he has broken his back and has back pain
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
## 2017-05-12 NOTE — ED Notes (Signed)
Lady with pt out to nurses station asking another nurse if we ere refusing to help the pt. This RN saw the lady from the pt's room and advised her that this RN was getting the pt's medicines ready at this time and would be in the as soon as this RN got them ready. Security, [place] and Viccoarnellious, Charity fundraiserN in with this RN to give pt meds and to discharge him.

## 2017-05-12 NOTE — Discharge Instructions (Signed)
You were given prescriptions for Flexeril and Naprosyn earlier today You should use these medicines until you can see your doctor You should see the doctor that treats your chronic pain   I spoke with your doctor - your Dr., Dr. Durene CalHunter - you can follow up in the office and you can see the attached resource guide for pain clinics.  We cannot give you any prescriptions for opiate pain medicines.

## 2017-05-12 NOTE — ED Notes (Signed)
Pt initially refusing the toradol. Pt stating, "does that medicine start with a t?" this RN advised pt that he had been ordered toradol, decadron and valium. Pt agreed to take the decadron and valium but stated, "that toradol leaves a knot on my ass and don't do shit for me."  Pt then agreed to take toradol. Pt continued to curse. This RN advised pt to be calm. Pt advised he was being discharged. Carnellious, RN offered pt a wheelchair. Pt agreed then stated, "hell no, let me get up and walk out of here so I can fall on my face and sue that stupid bitch." Pt however accepted the ride in the wheelchair. Pt then yelling at a nurse, thinking she was Dr. Lynelle DoctorKnapp, at the nurses station about her not giving him any pain meds. Security and Sempra EnergyCarnellious, RN continued to roll pt out to the car.

## 2017-05-12 NOTE — ED Notes (Signed)
Pt calling out stating his "butt" is burning and he needs something for pain, now. Pt advised taht the EDP would be into see him shortly.

## 2017-05-12 NOTE — ED Notes (Addendum)
ED Provider at bedside. [employer] and security officers outside of room due to pt [facility] . Pt loud with provider at this time

## 2017-05-31 DIAGNOSIS — M51369 Other intervertebral disc degeneration, lumbar region without mention of lumbar back pain or lower extremity pain: Secondary | ICD-10-CM | POA: Insufficient documentation

## 2017-05-31 DIAGNOSIS — G2581 Restless legs syndrome: Secondary | ICD-10-CM | POA: Insufficient documentation

## 2017-05-31 DIAGNOSIS — M5136 Other intervertebral disc degeneration, lumbar region: Secondary | ICD-10-CM | POA: Insufficient documentation

## 2017-05-31 NOTE — Progress Notes (Signed)
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 in counseling/coordination of care regarding: the appropriate use of the pain scale, opioid tolerance, Mr. Armistead primary cause of pain, the results of his recent test(s), the significance of each one oth the test(s) anomalies and it's corresponding characteristic pain pattern(s), the treatment plan, treatment alternatives, the risks and possible complications of proposed treatment, medication side effects, the opioid analgesic risks and possible complications, realistic expectations, the goals of pain management (increased in functionality), the medication agreement and the need to collect and read the AVS material.  Plan of Care  Pharmacotherapy (Medications Ordered): Meds ordered this encounter  Medications  . Oxycodone HCl 10 MG TABS    Sig: Take 1 tablet (10 mg total) by mouth every 8 (eight) hours.    Dispense:  90 tablet     Refill:  0    Do not place this medication, or any other prescription from our practice, on "Automatic Refill". Patient may have prescription filled one day early if pharmacy is closed on scheduled refill date. Do not fill until: 06/01/17 To last until: 07/01/17  . gabapentin (NEURONTIN) 800 MG tablet    Sig: Take 1 tablet (800 mg total) by mouth 3 (three) times daily.    Dispense:  90 tablet    Refill:  0    Do not place medication on "Automatic Refill". Fill one day early if pharmacy is closed on scheduled refill date.   Procedure Orders    No procedure(s) ordered today   Lab Orders  No laboratory test(s) ordered today   Imaging Orders  No imaging studies ordered today   Referral Orders  No referral(s) requested today    Pharmacological management options:  Opioid Analgesics: We'll take over management today. See above orders Membrane stabilizer: We have discussed the possibility of optimizing this mode of therapy, if tolerated Muscle relaxant: We have discussed the possibility of a trial NSAID: We have discussed the possibility of a trial Other analgesic(s): To be determined at a later time   Interventional management options: Planned, scheduled, and/or pending:    Diagnostic bilateral lumbar facet block    Considering:   Diagnostic bilateral lumbar facet block  Possible bilateral lumbar facet RFA  Diagnostic bilateral sacroiliac joint block  Possible bilateral sacroiliac joint RFA  Diagnostic caudal epidural steroid injection  Possible Racz  procedure  Diagnostic bilateral intra-articular hip joint injection  Diagnostic bilateral femoral nerve + obturator nerve block  Possible bilateral femoral nerve + obturator nerve RFA  Diagnostic bilateral L4-L5 transforaminal epidural steroid injection    PRN Procedures:   None at this time   Provider-requested follow-up: Return in about 2 weeks (around 06/15/2017) for Med-Mgmt w/ Dr. Dossie Arbour.  Future Appointments  Date  Time Provider [place]  06/15/2017 11:30 AM Milinda Pointer, MD [facility] None    Primary Care Physician: Abran Richard, MD Location: [facility] Note by: Gaspar Cola, MD Date: 06/01/2017; Time: 12:42 PM

## 2017-06-01 ENCOUNTER — Ambulatory Visit: Payer: Medicaid Other | Attending: Pain Medicine | Admitting: Pain Medicine

## 2017-06-01 ENCOUNTER — Encounter: Payer: Self-pay | Admitting: Pain Medicine

## 2017-06-01 VITALS — BP 152/86 | HR 98 | Temp 98.2°F | Resp 18 | Ht 73.0 in | Wt 180.0 lb

## 2017-06-01 DIAGNOSIS — M47816 Spondylosis without myelopathy or radiculopathy, lumbar region: Secondary | ICD-10-CM | POA: Insufficient documentation

## 2017-06-01 DIAGNOSIS — Z79899 Other long term (current) drug therapy: Secondary | ICD-10-CM | POA: Insufficient documentation

## 2017-06-01 DIAGNOSIS — M79605 Pain in left leg: Secondary | ICD-10-CM

## 2017-06-01 DIAGNOSIS — G8929 Other chronic pain: Secondary | ICD-10-CM | POA: Diagnosis not present

## 2017-06-01 DIAGNOSIS — M961 Postlaminectomy syndrome, not elsewhere classified: Secondary | ICD-10-CM

## 2017-06-01 DIAGNOSIS — F17210 Nicotine dependence, cigarettes, uncomplicated: Secondary | ICD-10-CM | POA: Insufficient documentation

## 2017-06-01 DIAGNOSIS — Z79891 Long term (current) use of opiate analgesic: Secondary | ICD-10-CM

## 2017-06-01 DIAGNOSIS — G894 Chronic pain syndrome: Secondary | ICD-10-CM | POA: Diagnosis present

## 2017-06-01 DIAGNOSIS — M7918 Myalgia, other site: Secondary | ICD-10-CM | POA: Diagnosis not present

## 2017-06-01 DIAGNOSIS — M545 Low back pain: Secondary | ICD-10-CM | POA: Diagnosis not present

## 2017-06-01 DIAGNOSIS — M5136 Other intervertebral disc degeneration, lumbar region: Secondary | ICD-10-CM | POA: Insufficient documentation

## 2017-06-01 DIAGNOSIS — F17200 Nicotine dependence, unspecified, uncomplicated: Secondary | ICD-10-CM | POA: Diagnosis not present

## 2017-06-01 DIAGNOSIS — F319 Bipolar disorder, unspecified: Secondary | ICD-10-CM | POA: Insufficient documentation

## 2017-06-01 DIAGNOSIS — M5442 Lumbago with sciatica, left side: Secondary | ICD-10-CM

## 2017-06-01 DIAGNOSIS — M792 Neuralgia and neuritis, unspecified: Secondary | ICD-10-CM

## 2017-06-01 DIAGNOSIS — F1021 Alcohol dependence, in remission: Secondary | ICD-10-CM | POA: Diagnosis not present

## 2017-06-01 DIAGNOSIS — M79671 Pain in right foot: Secondary | ICD-10-CM | POA: Diagnosis not present

## 2017-06-01 DIAGNOSIS — M79672 Pain in left foot: Secondary | ICD-10-CM | POA: Insufficient documentation

## 2017-06-01 DIAGNOSIS — G2581 Restless legs syndrome: Secondary | ICD-10-CM | POA: Insufficient documentation

## 2017-06-01 DIAGNOSIS — M79604 Pain in right leg: Secondary | ICD-10-CM | POA: Diagnosis not present

## 2017-06-01 DIAGNOSIS — M5441 Lumbago with sciatica, right side: Secondary | ICD-10-CM

## 2017-06-01 MED ORDER — OXYCODONE HCL 10 MG PO TABS: 10.0000 mg | ORAL_TABLET | Freq: Three times a day (TID) | ORAL | 0 refills | Status: AC

## 2017-06-01 MED ORDER — GABAPENTIN 800 MG PO TABS: 800.0000 mg | ORAL_TABLET | Freq: Three times a day (TID) | ORAL | 0 refills | Status: AC

## 2017-06-01 NOTE — Patient Instructions (Signed)
____________________________________________________________________________________________  Medication Rules  Applies to: All patients receiving prescriptions (written or electronic).  Pharmacy of record: Pharmacy where electronic prescriptions will be sent. If written prescriptions are taken to a different pharmacy, please inform the nursing staff. The pharmacy listed in the electronic medical record should be the one where you would like electronic prescriptions to be sent.  Prescription refills: Only during scheduled appointments. Applies to both, written and electronic prescriptions.  NOTE: The following applies primarily to controlled substances (Opioid* Pain Medications).   Patient's responsibilities: 1. Pain Pills: Bring all pain pills to every appointment (except for procedure appointments). 2. Pill Bottles: Bring pills in original pharmacy bottle. Always bring newest bottle. Bring bottle, even if empty. 3. Medication refills: You are responsible for knowing and keeping track of what medications you need refilled. The day before your appointment, write a list of all prescriptions that need to be refilled. Bring that list to your appointment and give it to the admitting nurse. Prescriptions will be written only during appointments. If you forget a medication, it will not be "Called in", "Faxed", or "electronically sent". You will need to get another appointment to get these prescribed. 4. Prescription Accuracy: You are responsible for carefully inspecting your prescriptions before leaving our office. Have the discharge nurse carefully go over each prescription with you, before taking them home. Make sure that your name is accurately spelled, that your address is correct. Check the name and dose of your medication to make sure it is accurate. Check the number of pills, and the written instructions to make sure they are clear and accurate. Make sure that you are given enough medication to  last until your next medication refill appointment. 5. Taking Medication: Take medication as prescribed. Never take more pills than instructed. Never take medication more frequently than prescribed. Taking less pills or less frequently is permitted and encouraged, when it comes to controlled substances (written prescriptions).  6. Inform other Doctors: Always inform, all of your healthcare providers, of all the medications you take. 7. Pain Medication from other Providers: You are not allowed to accept any additional pain medication from any other Doctor or Healthcare provider. There are two exceptions to this rule. (see below) In the event that you require additional pain medication, you are responsible for notifying us, as stated below. 8. Medication Agreement: You are responsible for carefully reading and following our Medication Agreement. This must be signed before receiving any prescriptions from our practice. Safely store a copy of your signed Agreement. Violations to the Agreement will result in no further prescriptions. (Additional copies of our Medication Agreement are available upon request.) 9. Laws, Rules, & Regulations: All patients are expected to follow all Federal and State Laws, Statutes, Rules, & Regulations. Ignorance of the Laws does not constitute a valid excuse. The use of any illegal substances is prohibited. 10. Adopted CDC guidelines & recommendations: Target dosing levels will be at or below 60 MME/day. Use of benzodiazepines** is not recommended.  Exceptions: There are only two exceptions to the rule of not receiving pain medications from other Healthcare Providers. 1. Exception #1 (Emergencies): In the event of an emergency (i.e.: accident requiring emergency care), you are allowed to receive additional pain medication. However, you are responsible for: As soon as you are able, call our office [phone], at any time of the day or night, and leave a message stating your  name, the date and nature of the emergency, and the name and dose of the medication   prescribed. In the event that your call is answered by a member of our staff, make sure to document and save the date, time, and the name of the person that took your information.  2. Exception #2 (Planned Surgery): In the event that you are scheduled by another doctor or dentist to have any type of surgery or procedure, you are allowed (for a period no longer than 30 days), to receive additional pain medication, for the acute post-op pain. However, in this case, you are responsible for picking up a copy of our "Post-op Pain Management for Surgeons" handout, and giving it to your surgeon or dentist. This document is available at our office, and does not require an appointment to obtain it. Simply go to our office during business hours (Monday-Thursday from 8:00 AM to 4:00 PM) (Friday 8:00 AM to 12:00 Noon) or if you have a scheduled appointment with [place], prior to your surgery, and ask for it by name. In addition, you will need to provide [place] with your name, name of your surgeon, type of surgery, and date of procedure or surgery.  *Opioid medications include: morphine, codeine, oxycodone, oxymorphone, hydrocodone, hydromorphone, meperidine, tramadol, tapentadol, buprenorphine, fentanyl, methadone. **Benzodiazepine medications include: diazepam (Valium), alprazolam (Xanax), clonazepam (Klonopine), lorazepam (Ativan), clorazepate (Tranxene), chlordiazepoxide (Librium), estazolam (Prosom), oxazepam (Serax), temazepam (Restoril), triazolam (Halcion)  ____________________________________________________________________________________________ ____________________________________________________________________________________________  Pain Scale  Introduction: The pain score used by this practice is the Verbal Numerical Rating Scale (VNRS-11). This is an 11-point scale. It is for adults and children 10 years or older. There are  significant differences in how the pain score is reported, used, and applied. Forget everything you learned in the past and learn this scoring system.  General Information: The scale should reflect your current level of pain. Unless you are specifically asked for the level of your worst pain, or your average pain. If you are asked for one of these two, then it should be understood that it is over the past 24 hours.  Basic Activities of Daily Living (ADL): Personal hygiene, dressing, eating, transferring, and using restroom.  Instructions: Most patients tend to report their level of pain as a combination of two factors, their physical pain and their psychosocial pain. This last one is also known as "suffering" and it is reflection of how physical pain affects you socially and psychologically. From now on, report them separately. From this point on, when asked to report your pain level, report only your physical pain. Use the following table for reference.  Pain Clinic Pain Levels (0-5/10)  Pain Level Score  Description  No Pain 0   Mild pain 1 Nagging, annoying, but does not interfere with basic activities of daily living (ADL). Patients are able to eat, bathe, get dressed, toileting (being able to get on and off the toilet and perform personal hygiene functions), transfer (move in and out of bed or a chair without assistance), and maintain continence (able to control bladder and bowel functions). Blood pressure and heart rate are unaffected. A normal heart rate for a healthy adult ranges from 60 to 100 bpm (beats per minute).   Mild to moderate pain 2 Noticeable and distracting. Impossible to hide from other people. More frequent flare-ups. Still possible to adapt and function close to normal. It can be very annoying and may have occasional stronger flare-ups. With discipline, patients may get used to it and adapt.   Moderate pain 3 Interferes significantly with activities of daily living (ADL). It  becomes difficult to  feed, bathe, get dressed, get on and off the toilet or to perform personal hygiene functions. Difficult to get in and out of bed or a chair without assistance. Very distracting. With effort, it can be ignored when deeply involved in activities.   Moderately severe pain 4 Impossible to ignore for more than a few minutes. With effort, patients may still be able to manage work or participate in some social activities. Very difficult to concentrate. Signs of autonomic nervous system discharge are evident: dilated pupils (mydriasis); mild sweating (diaphoresis); sleep interference. Heart rate becomes elevated (>115 bpm). Diastolic blood pressure (lower number) rises above 100 mmHg. Patients find relief in laying down and not moving.   Severe pain 5 Intense and extremely unpleasant. Associated with frowning face and frequent crying. Pain overwhelms the senses.  Ability to do any activity or maintain social relationships becomes significantly limited. Conversation becomes difficult. Pacing back and forth is common, as getting into a comfortable position is nearly impossible. Pain wakes you up from deep sleep. Physical signs will be obvious: pupillary dilation; increased sweating; goosebumps; brisk reflexes; cold, clammy hands and feet; nausea, vomiting or dry heaves; loss of appetite; significant sleep disturbance with inability to fall asleep or to remain asleep. When persistent, significant weight loss is observed due to the complete loss of appetite and sleep deprivation.  Blood pressure and heart rate becomes significantly elevated. Caution: If elevated blood pressure triggers a pounding headache associated with blurred vision, then the patient should immediately seek attention at an urgent or emergency care unit, as these may be signs of an impending stroke.    Emergency Department Pain Levels (6-10/10)  Emergency Room Pain 6 Severely limiting. Requires emergency care and should not be  seen or managed at an outpatient pain management facility. Communication becomes difficult and requires great effort. Assistance to reach the emergency department may be required. Facial flushing and profuse sweating along with potentially dangerous increases in heart rate and blood pressure will be evident.   Distressing pain 7 Self-care is very difficult. Assistance is required to transport, or use restroom. Assistance to reach the emergency department will be required. Tasks requiring coordination, such as bathing and getting dressed become very difficult.   Disabling pain 8 Self-care is no longer possible. At this level, pain is disabling. The individual is unable to do even the most "basic" activities such as walking, eating, bathing, dressing, transferring to a bed, or toileting. Fine motor skills are lost. It is difficult to think clearly.   Incapacitating pain 9 Pain becomes incapacitating. Thought processing is no longer possible. Difficult to remember your own name. Control of movement and coordination are lost.   The worst pain imaginable 10 At this level, most patients pass out from pain. When this level is reached, collapse of the autonomic nervous system occurs, leading to a sudden drop in blood pressure and heart rate. This in turn results in a temporary and dramatic drop in blood flow to the brain, leading to a loss of consciousness. Fainting is one of the body's self defense mechanisms. Passing out puts the brain in a calmed state and causes it to shut down for a while, in order to begin the healing process.    Summary: 1. Refer to this scale when providing us with your pain level. 2. Be accurate and careful when reporting your pain level. This will help with your care. 3. Over-reporting your pain level will lead to loss of credibility. 4. Even a level of 1/10   means that there is pain and will be treated at our facility. 5. High, inaccurate reporting will be documented as "Symptom  Exaggeration", leading to loss of credibility and suspicions of possible secondary gains such as obtaining more narcotics, or wanting to appear disabled, for fraudulent reasons. 6. Only pain levels of 5 or below will be seen at our facility. 7. Pain levels of 6 and above will be sent to the Emergency Department and the appointment cancelled. ____________________________________________________________________________________________   ____________________________________________________________________________________________  Appointment Policy Summary  It is our goal and responsibility to provide the medical community with assistance in the evaluation and management of patients with chronic pain. Unfortunately our resources are limited. Because we do not have an unlimited amount of time, or available appointments, we are required to closely monitor and manage their use. The following rules exist to maximize their use:  Patient's responsibilities: 1. Punctuality:  At what time should I arrive? You should be physically present in our office 30 minutes before your scheduled appointment. Your scheduled appointment is with your assigned healthcare provider. However, it takes 5-10 minutes to be "checked-in", and another 15 minutes for the nurses to do the admission. If you arrive to our office at the time you were given for your appointment, you will end up being at least 20-25 minutes late to your appointment with the provider. 2. Tardiness:  What happens if I arrive only a few minutes after my scheduled appointment time? You will need to reschedule your appointment. The cutoff is your appointment time. This is why it is so important that you arrive at least 30 minutes before that appointment. If you have an appointment scheduled for 10:00 AM and you arrive at 10:01, you will be required to reschedule your appointment.  3. Plan ahead:  Always assume that you will encounter traffic on your way in. Plan  for it. If you are dependent on a driver, make sure they understand these rules and the need to arrive early. 4. Other appointments and responsibilities:  Avoid scheduling any other appointments before or after your pain clinic appointments.  5. Be prepared:  Write down everything that you need to discuss with your healthcare provider and give this information to the admitting nurse. Write down the medications that you will need refilled. Bring your pills and bottles (even the empty ones), to all of your appointments, except for those where a procedure is scheduled. 6. No children or pets:  Find someone to take care of them. It is not appropriate to bring them in. 7. Scheduling changes:  We request "advanced notification" of any changes or cancellations. 8. Advanced notification:  Defined as a time period of more than 24 hours prior to the originally scheduled appointment. This allows for the appointment to be offered to other patients. 9. Rescheduling:  When a visit is rescheduled, it will require the cancellation of the original appointment. For this reason they both fall within the category of "Cancellations".  10. Cancellations:  They require advanced notification. Any cancellation less than 24 hours before the  appointment will be recorded as a "No Show". 11. No Show:  Defined as an unkept appointment where the patient failed to notify or declare to the practice their intention or inability to keep the appointment.  Corrective process for repeat offenders:  1. Tardiness: Three (3) episodes of rescheduling due to late arrivals will be recorded as one (1) "No Show". 2. Cancellation or reschedule: Three (3) cancellations or rescheduling will be recorded as one (  1) "No Show". 3. "No Shows": Three (3) "No Shows" within a 12 month period will result in discharge from the  practice.  ____________________________________________________________________________________________  ____________________________________________________________________________________________  DRUG HOLIDAYS  What is a "Drug Holiday"? Drug Holiday: is the name given to the period of time during which a patient stops taking a medication(s) for the purpose of eliminating tolerance to the drug.  Benefits . Improved effectiveness of opioids. . Decreased opioid dose needed to achieve benefits. . Improved pain with lesser dose.  What is tolerance? Tolerance: is the progressive decreased in effectiveness of a drug due to its repetitive use. With repetitive use, the body gets use to the medication and as a consequence, it loses its effectiveness. This is a common problem seen with opioid pain medications. As a result, a larger dose of the drug is needed to achieve the same effect that used to be obtained with a smaller dose.  How long should a "Drug Holiday" last? At least 14 consecutive days. (2 weeks)  What are withdrawals? Withdrawals: refers to the wide range of symptoms that occur after stopping or dramatically reducing opiate drugs after heavy and prolonged use. Withdrawal symptoms do not occur to patients that use low dose opioids, or those who take the medication sporadically. Contrary to benzodiazepine (example: Valium, Xanax, etc.) or alcohol withdrawals ("Delirium Tremens"), opioid withdrawals are not lethal. Withdrawals are the physical manifestation of the body getting rid of the excess receptors.  Expected Symptoms Early symptoms of withdrawal may include: . Agitation . Anxiety . Muscle aches . Increased tearing . Insomnia . Runny nose . Sweating . Yawning  Late symptoms of withdrawal may include: . Abdominal cramping . Diarrhea . Dilated pupils . Goose bumps . Nausea . Vomiting  Will I experience withdrawals? Due to the slow nature of the taper, it is very  unlikely that you will experience any.  What is a slow taper? Taper: refers to the gradual decrease in dose. ___________________________________________________________________________________________ ____________________________________________________________________________________________  Smokers present with more severe and extended chronic pain outcomes and have a higher frequency of prescription opioid use. Current tobacco smoking is a strong predictor of risk for nonmedical use of prescription opioids. Opioid and nicotinic-cholinergic neurotransmitter systems interact in important ways to modulate opioid and nicotine effects: dopamine release induced by nicotine is dependent on facilitation by the opioid system, and the nicotinic-acetylcholine system modulates self-administration of several classes of abused drugs-including opioids. Nicotine can serve as a prime for the use of other drugs, which in the case of the opioid system may be bidirectional. Opioids and compounds in tobacco, including nicotine, are metabolized by the cytochrome P450 enzyme system, but the metabolism of opioids and tobacco products can be complicated. Accordingly, drug interactions are possible but not always clear.  ____________________________________________________________________________________________

## 2017-06-01 NOTE — Progress Notes (Signed)
Safety precautions to be maintained throughout the outpatient stay will include: orient to surroundings, keep bed in low position, maintain call bell within reach at all times, provide assistance with transfer out of bed and ambulation.  

## 2017-06-03 ENCOUNTER — Telehealth: Payer: Self-pay

## 2017-06-03 DIAGNOSIS — M47816 Spondylosis without myelopathy or radiculopathy, lumbar region: Secondary | ICD-10-CM | POA: Insufficient documentation

## 2017-06-03 NOTE — Telephone Encounter (Signed)
Called and spoke with caregiver to let the patient know that PA has been sent for oxycodone 10 mg via [facility].

## 2017-06-03 NOTE — Telephone Encounter (Signed)
paitent put his script in Monday and it needs prior auth and they are waiting for it. Patients wife said she wants someone to call them back to let them know it was done.

## 2017-06-10 ENCOUNTER — Other Ambulatory Visit: Payer: Self-pay

## 2017-06-15 ENCOUNTER — Ambulatory Visit: Payer: Medicaid Other | Admitting: Pain Medicine

## 2017-06-23 DIAGNOSIS — Z79899 Other long term (current) drug therapy: Secondary | ICD-10-CM | POA: Insufficient documentation

## 2017-06-23 DIAGNOSIS — M25551 Pain in right hip: Secondary | ICD-10-CM

## 2017-06-23 DIAGNOSIS — G8929 Other chronic pain: Secondary | ICD-10-CM | POA: Insufficient documentation

## 2017-06-23 DIAGNOSIS — M5442 Lumbago with sciatica, left side: Secondary | ICD-10-CM

## 2017-06-23 DIAGNOSIS — M5441 Lumbago with sciatica, right side: Secondary | ICD-10-CM

## 2017-06-23 DIAGNOSIS — M25552 Pain in left hip: Secondary | ICD-10-CM

## 2017-06-23 NOTE — Progress Notes (Signed)
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 options: Planned, scheduled, and/or pending:   None at this time.    Considering:   Diagnostic bilateral lumbar facet block  Possible bilateral lumbar facet RFA  Diagnostic bilateral sacroiliac joint block  Possible bilateral sacroiliac joint RFA  Diagnostic caudal epidural steroid injection  Possible Racz procedure  Diagnostic bilateral intra-articular hip joint injection  Diagnostic bilateral femoral nerve + obturator nerve block  Possible bilateral femoral nerve + obturator nerve RFA  Diagnostic bilateral L4-L5 transforaminal epidural steroid injection    Palliative PRN treatment(s):   None at this time   Provider-requested follow-up: No Follow-up on file. This patient will not be offered any further appointments in my practice.  No future appointments. Primary Care Physician: Abran Richard, MD Location: [facility] Note by: Gaspar Cola, MD Date:  06/24/2017; Time: 9:03 PM

## 2017-06-24 ENCOUNTER — Ambulatory Visit: Payer: Medicaid Other | Attending: Pain Medicine | Admitting: Pain Medicine

## 2017-06-24 ENCOUNTER — Encounter: Payer: Self-pay | Admitting: Pain Medicine

## 2017-06-24 ENCOUNTER — Other Ambulatory Visit: Payer: Self-pay

## 2017-06-24 VITALS — BP 142/90 | HR 76 | Temp 97.7°F | Ht 74.0 in | Wt 181.0 lb

## 2017-06-24 DIAGNOSIS — F17200 Nicotine dependence, unspecified, uncomplicated: Secondary | ICD-10-CM | POA: Diagnosis not present

## 2017-06-24 DIAGNOSIS — F1390 Sedative, hypnotic, or anxiolytic use, unspecified, uncomplicated: Secondary | ICD-10-CM | POA: Diagnosis not present

## 2017-06-24 DIAGNOSIS — Z650 Conviction in civil and criminal proceedings without imprisonment: Secondary | ICD-10-CM | POA: Diagnosis not present

## 2017-06-24 DIAGNOSIS — Z79899 Other long term (current) drug therapy: Secondary | ICD-10-CM | POA: Diagnosis not present

## 2017-06-24 DIAGNOSIS — Z5181 Encounter for therapeutic drug level monitoring: Secondary | ICD-10-CM | POA: Insufficient documentation

## 2017-06-24 DIAGNOSIS — M79604 Pain in right leg: Secondary | ICD-10-CM | POA: Diagnosis not present

## 2017-06-24 DIAGNOSIS — M25551 Pain in right hip: Secondary | ICD-10-CM | POA: Diagnosis not present

## 2017-06-24 DIAGNOSIS — M5442 Lumbago with sciatica, left side: Secondary | ICD-10-CM

## 2017-06-24 DIAGNOSIS — Z79891 Long term (current) use of opiate analgesic: Secondary | ICD-10-CM

## 2017-06-24 DIAGNOSIS — M7918 Myalgia, other site: Secondary | ICD-10-CM | POA: Diagnosis not present

## 2017-06-24 DIAGNOSIS — J439 Emphysema, unspecified: Secondary | ICD-10-CM | POA: Insufficient documentation

## 2017-06-24 DIAGNOSIS — G2581 Restless legs syndrome: Secondary | ICD-10-CM | POA: Insufficient documentation

## 2017-06-24 DIAGNOSIS — M961 Postlaminectomy syndrome, not elsewhere classified: Secondary | ICD-10-CM | POA: Diagnosis not present

## 2017-06-24 DIAGNOSIS — M5441 Lumbago with sciatica, right side: Secondary | ICD-10-CM

## 2017-06-24 DIAGNOSIS — M5136 Other intervertebral disc degeneration, lumbar region: Secondary | ICD-10-CM | POA: Diagnosis not present

## 2017-06-24 DIAGNOSIS — M25552 Pain in left hip: Secondary | ICD-10-CM | POA: Insufficient documentation

## 2017-06-24 DIAGNOSIS — Z9114 Patient's other noncompliance with medication regimen: Secondary | ICD-10-CM | POA: Diagnosis not present

## 2017-06-24 DIAGNOSIS — Z87898 Personal history of other specified conditions: Secondary | ICD-10-CM

## 2017-06-24 DIAGNOSIS — G894 Chronic pain syndrome: Secondary | ICD-10-CM | POA: Diagnosis not present

## 2017-06-24 DIAGNOSIS — F319 Bipolar disorder, unspecified: Secondary | ICD-10-CM

## 2017-06-24 DIAGNOSIS — G8929 Other chronic pain: Secondary | ICD-10-CM

## 2017-06-24 DIAGNOSIS — F1021 Alcohol dependence, in remission: Secondary | ICD-10-CM | POA: Diagnosis not present

## 2017-06-24 DIAGNOSIS — F17210 Nicotine dependence, cigarettes, uncomplicated: Secondary | ICD-10-CM | POA: Insufficient documentation

## 2017-06-24 DIAGNOSIS — M79605 Pain in left leg: Secondary | ICD-10-CM | POA: Diagnosis not present

## 2017-06-24 DIAGNOSIS — M47896 Other spondylosis, lumbar region: Secondary | ICD-10-CM | POA: Insufficient documentation

## 2017-06-24 DIAGNOSIS — Z880 Allergy status to penicillin: Secondary | ICD-10-CM | POA: Insufficient documentation

## 2017-06-24 NOTE — Progress Notes (Signed)
Nursing Pain Medication Assessment:  Safety precautions to be maintained throughout the outpatient stay will include: orient to surroundings, keep bed in low position, maintain call bell within reach at all times, provide assistance with transfer out of bed and ambulation.  Medication Inspection Compliance: Pill count conducted under aseptic conditions, in front of the patient. Neither the pills nor the bottle was removed from the patient's sight at any time. Once count was completed pills were immediately returned to the patient in their original bottle.  Medication #1: clonazopam Pill/Patch Count: 56 of 120 pills remain Pill/Patch Appearance: Markings consistent with prescribed medication Bottle Appearance: Standard pharmacy container. Clearly labeled. Filled Date: 7312 / 6 / 2018 Last Medication intake:  Today  Medication #2: Oxycodone IR Pill/Patch Count: 0 of 90 pills remain Pill/Patch Appearance: Markings consistent with prescribed medication Bottle Appearance: Standard pharmacy container. Clearly labeled. Filled Date: 5811 / 29 / 2018 Last Medication intake:  06/22/2017 at 4pm   During admission when asking the patient questions about his pysh. History, patient states  "My pain is so bad that I feel like killing myself." After further questioning and asking if he would be better off dead, the patient replied " several times". Patient kept repeating that the pain was so bad that it made him depressed.  After counting the patients narc, he was then asked if he was taking them as prescribed and pt said "No" "The doctor changed my medicines that were working and I had to take more  Of the medicines he prescribed". Last medication was taken Monday 06/22/2017 at 4pm. Informed Dr Shireen QuanNaviera and notified security. Security came and stood at the door while Dr Shireen QuanNaviera was in the room talking with the patient. Pt was escorted off the unit with security.

## 2017-06-24 NOTE — Patient Instructions (Signed)

## 2017-06-25 DIAGNOSIS — F319 Bipolar disorder, unspecified: Secondary | ICD-10-CM | POA: Insufficient documentation

## 2017-06-25 DIAGNOSIS — Z650 Conviction in civil and criminal proceedings without imprisonment: Secondary | ICD-10-CM | POA: Insufficient documentation

## 2017-06-25 DIAGNOSIS — Z87898 Personal history of other specified conditions: Secondary | ICD-10-CM | POA: Insufficient documentation

## 2017-08-26 ENCOUNTER — Other Ambulatory Visit: Payer: Self-pay | Admitting: Pain Medicine

## 2017-08-26 DIAGNOSIS — M792 Neuralgia and neuritis, unspecified: Secondary | ICD-10-CM

## 2019-08-27 IMAGING — CR DG HIP (WITH OR WITHOUT PELVIS) 2-3V*L*
1 series · 3 of 3 positions shown · non-contrast
Comparison: None.

CLINICAL DATA: Chronic bilateral hip pain.

EXAM:
DG HIP (WITH OR WITHOUT PELVIS) 2-3V LEFT

[Series 1: dg hip unilat w or w/o pelvis 2-3 views  · non-contrast · 0.14mm/px · 3 of 3 slices shown]
[im 1/3]
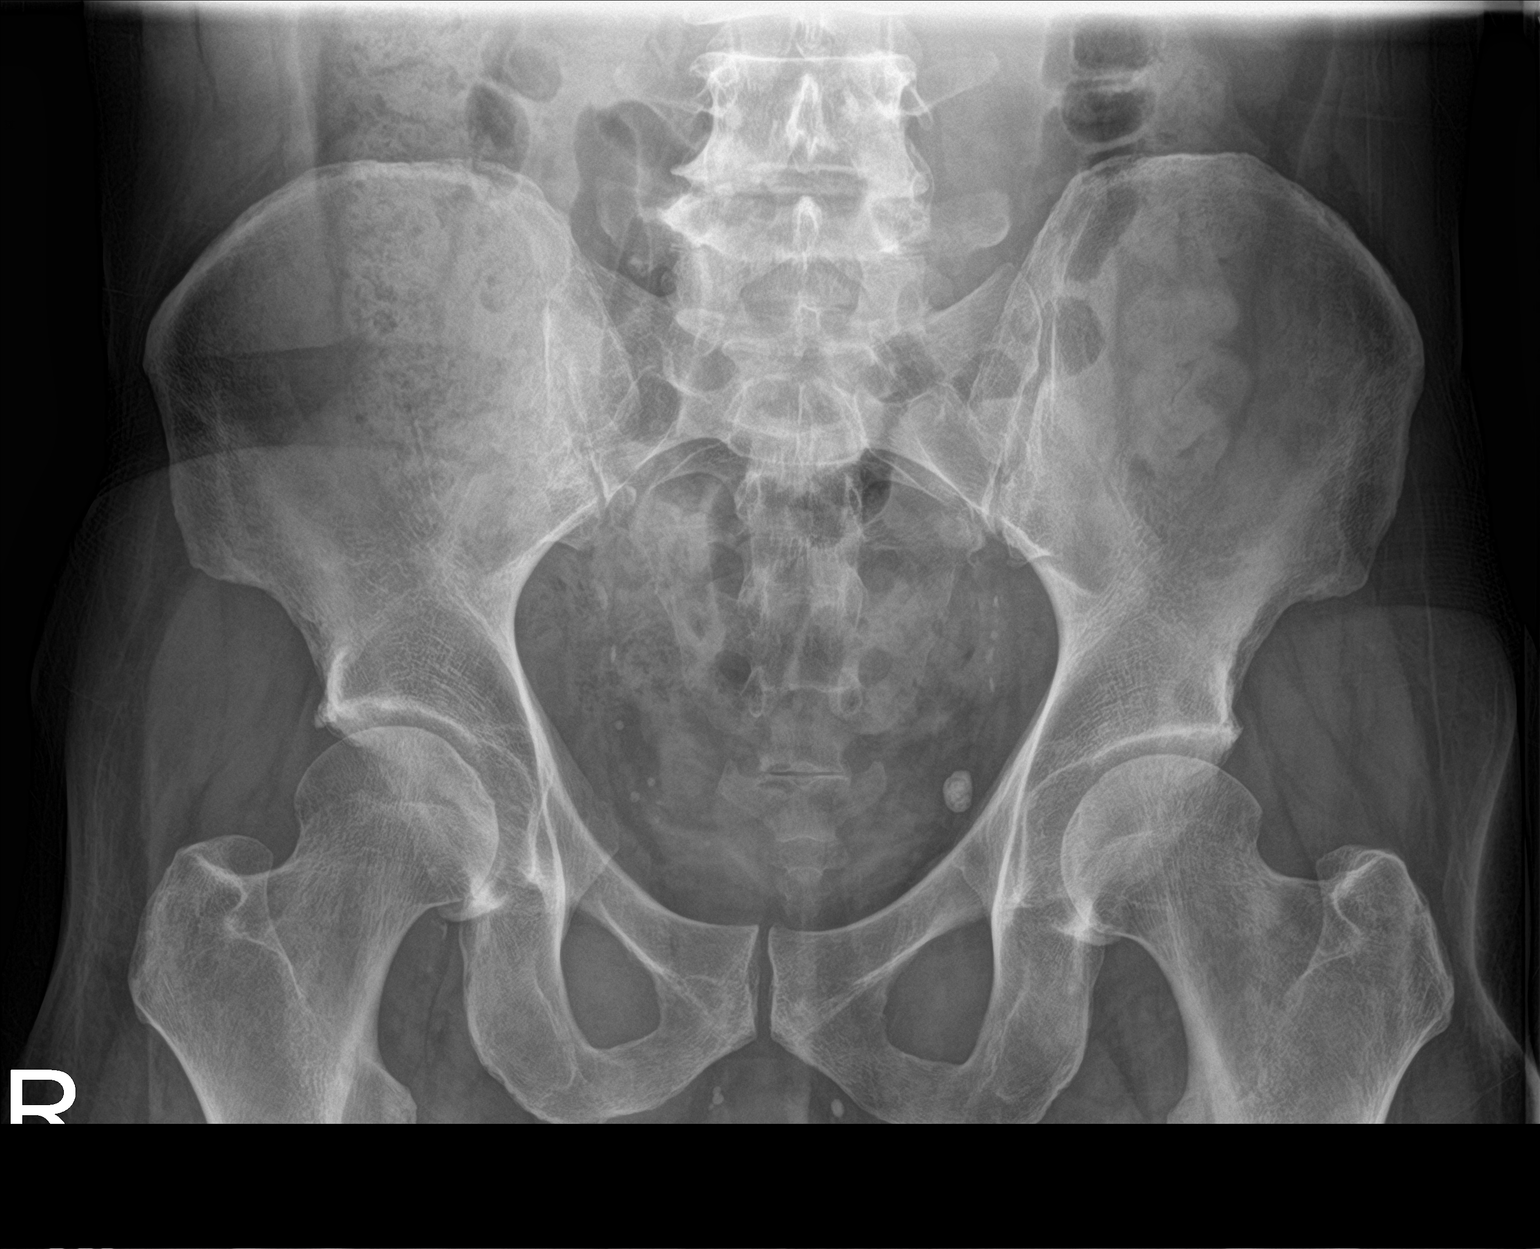
[im 2/3]
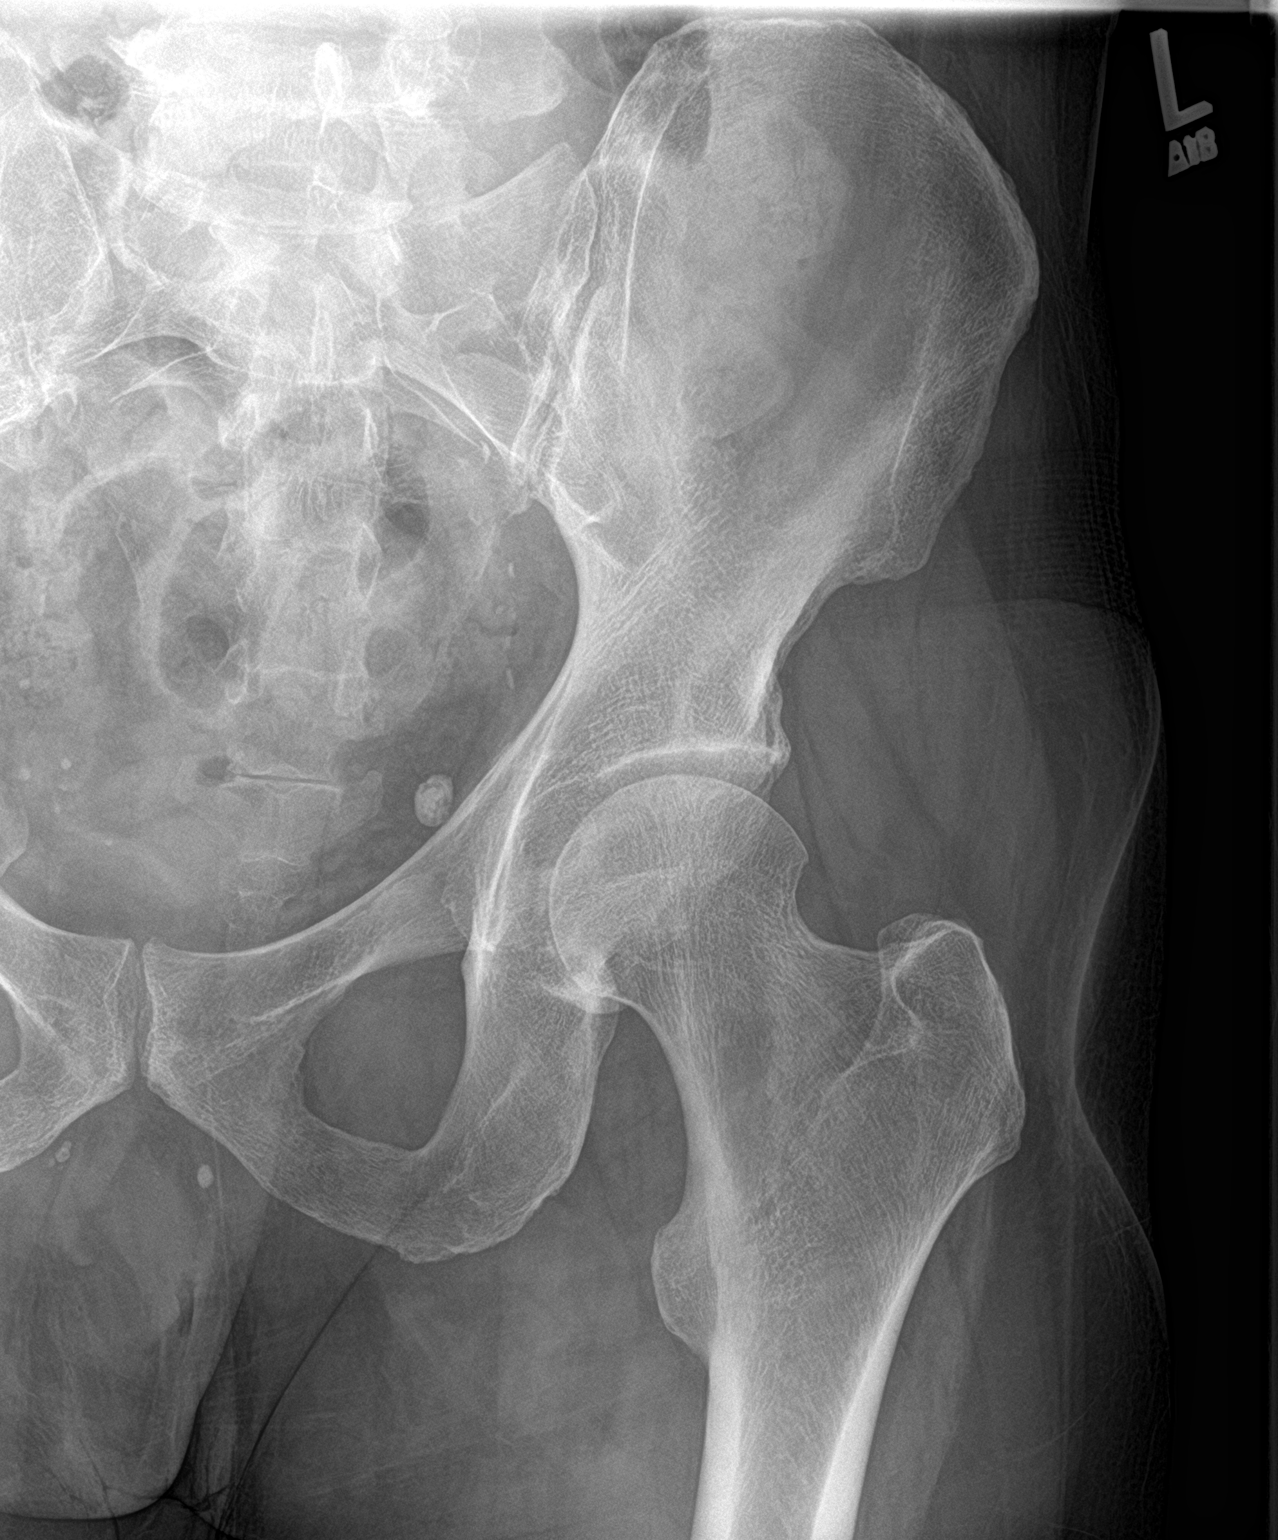
[im 3/3]
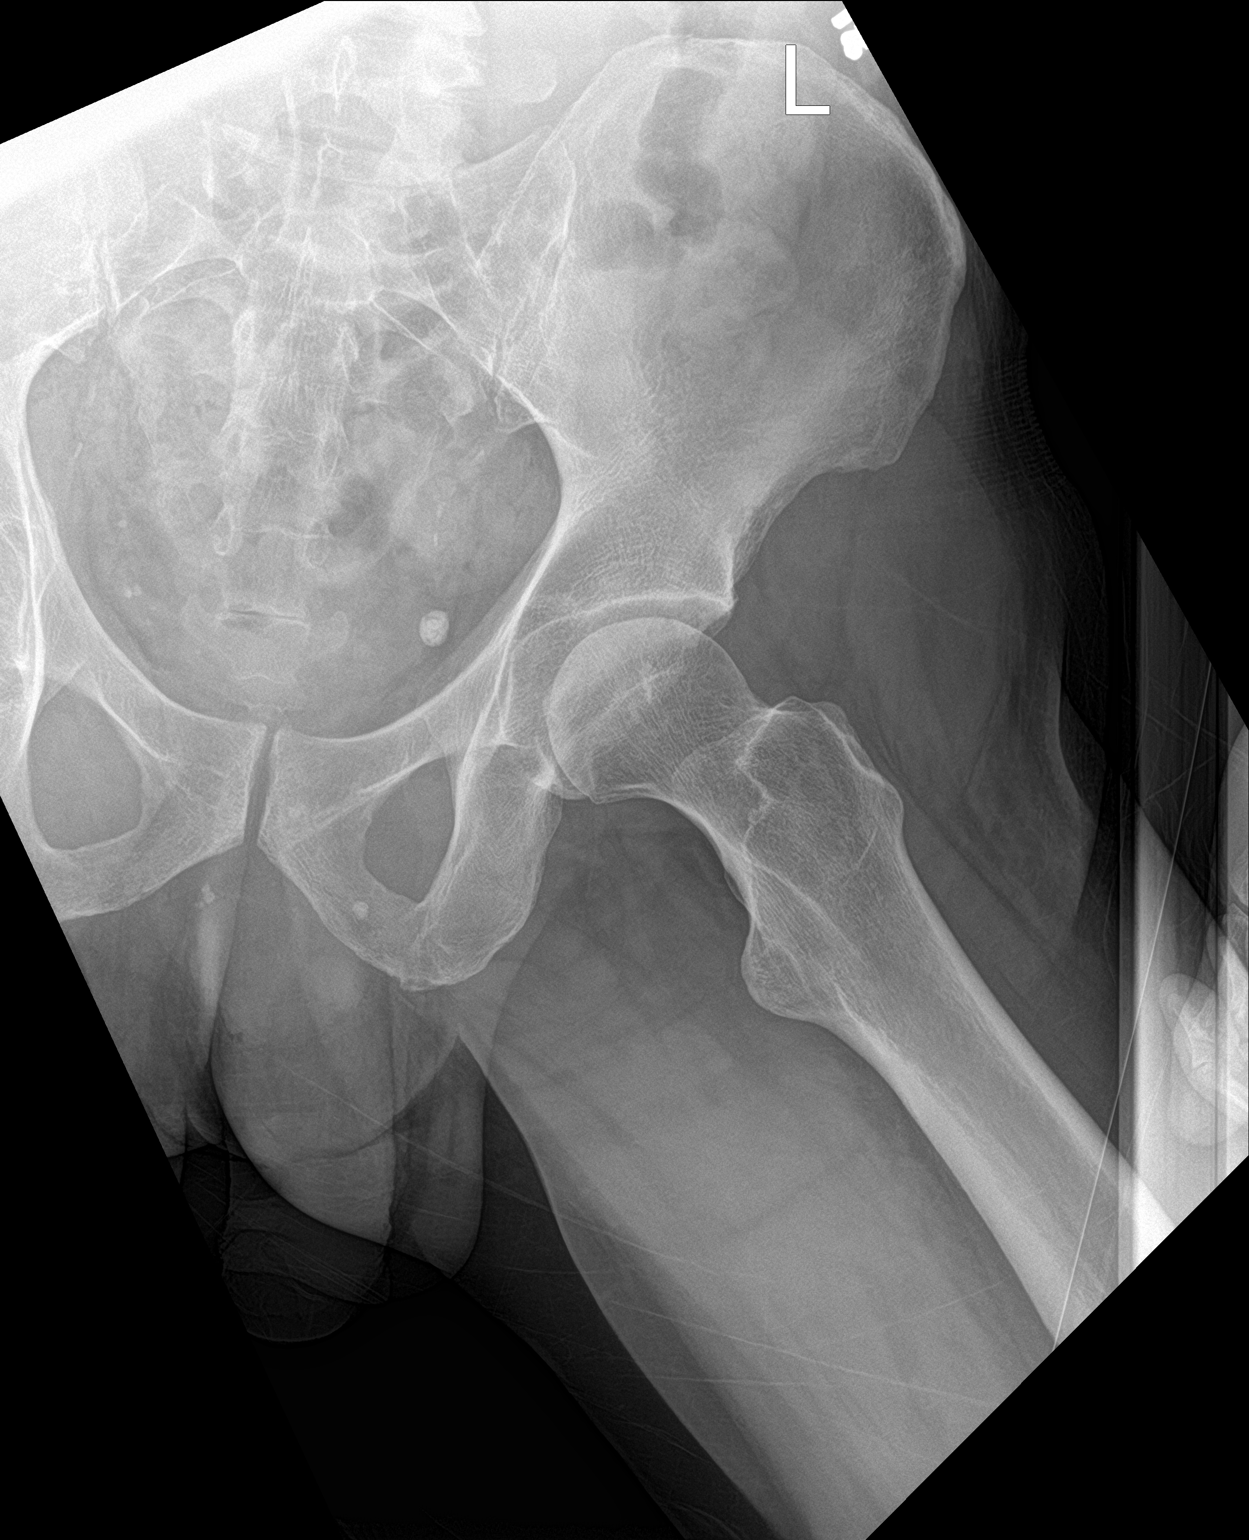

[3 of 3 positions shown; findings below may reference images not displayed]

FINDINGS: No fracture or dislocation. Left hip joint spaces appear preserved.
No evidence avascular necrosis.

Limited visualization of the pelvis right hip is normal.

Several phleboliths overlie the lower pelvis bilaterally. Regional
soft tissues appear otherwise normal.
IMPRESSION: No explanation for patient's chronic left hip pain.

## 2019-08-27 IMAGING — CR DG SI JOINTS 3+V
1 series · 3 of 3 positions shown · non-contrast
Comparison: Lumbar spine radiographs - earlier same day

CLINICAL DATA: Chronic back and SI joint pain.

EXAM:
BILATERAL SACROILIAC JOINTS - 3+ VIEW

[Series 1: dg si joints · 0.14mm/px · 3 of 3 slices shown]
[im 1/3]
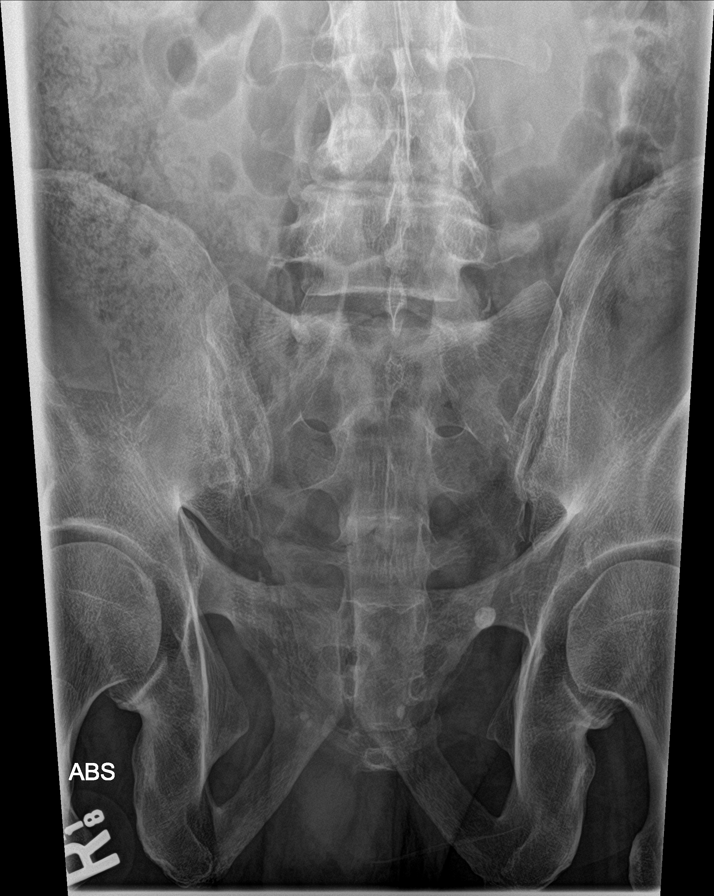
[im 2/3]
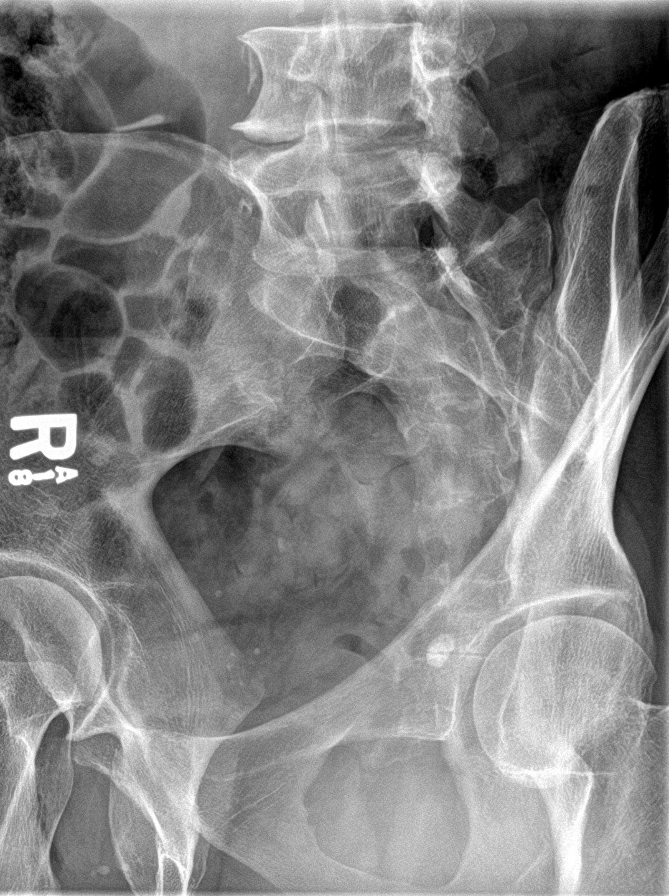
[im 3/3]
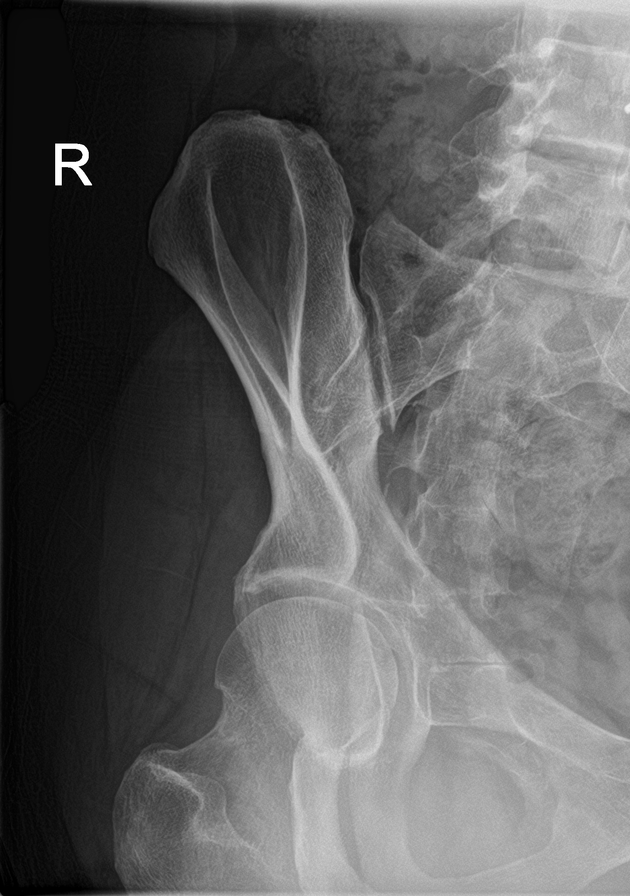

[3 of 3 positions shown; findings below may reference images not displayed]

FINDINGS: No fracture or dislocation. Bilateral SI joints appear preserved. No
evidence of sacroiliitis. Limited visualization of the pubic
symphysis appears normal.

Several phleboliths overlie the lower pelvis bilaterally. Regional
soft tissues appear otherwise normal.
IMPRESSION: No evidence of sacroiliitis.

## 2019-08-27 IMAGING — CR DG LUMBAR SPINE COMPLETE W/ BEND
1 series · 7 of 7 positions shown · non-contrast
Comparison: SI joint radiographs - earlier same date

CLINICAL DATA: Low back and bilateral hip pain.

EXAM:
LUMBAR SPINE - COMPLETE WITH BENDING VIEWS

[Series 1: dg lumbar spine complete w/bend 6+v · 0.14mm/px · 7 of 7 slices shown]
[im 1/7]
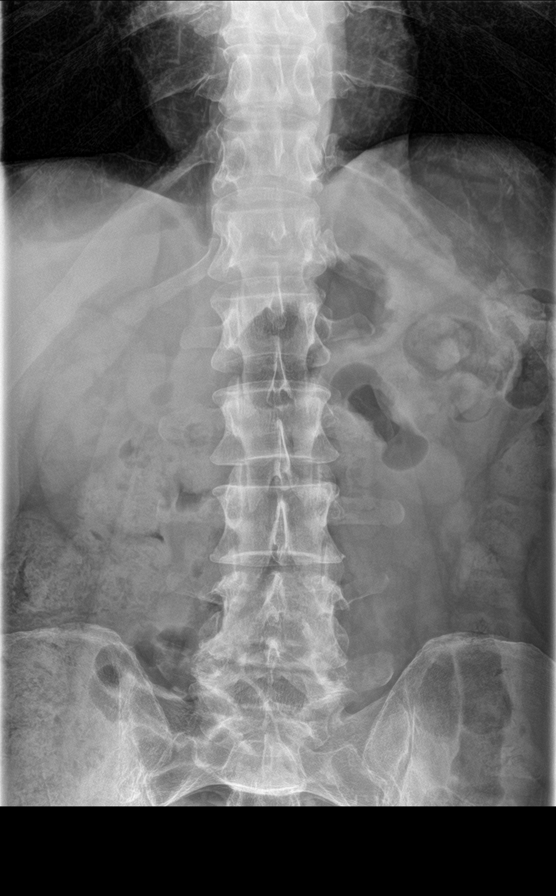
[im 2/7]
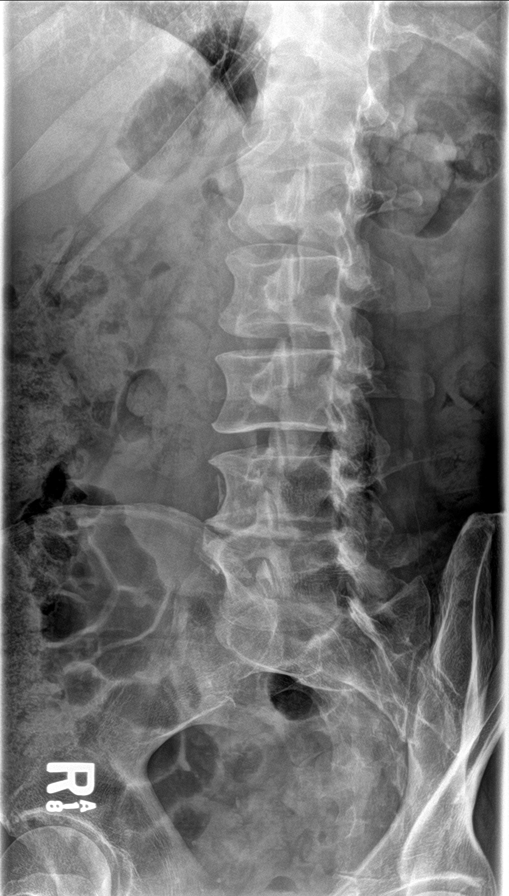
[im 3/7]
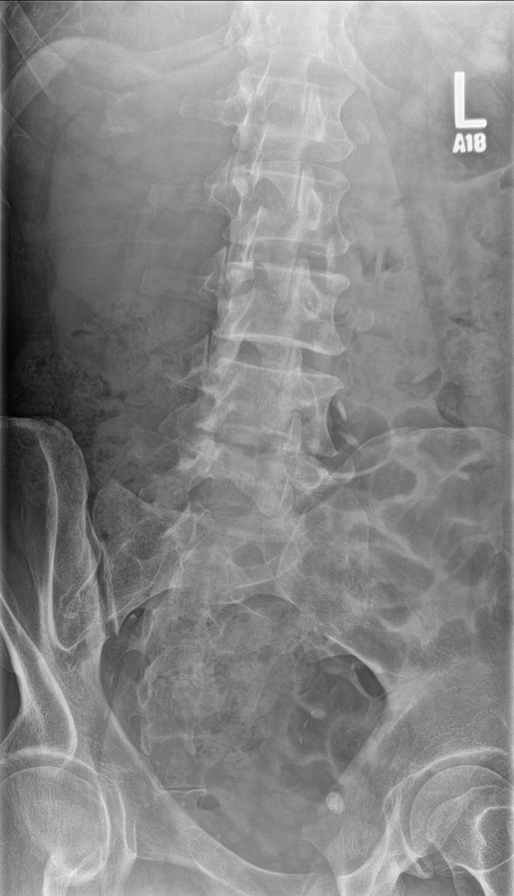
[im 4/7]
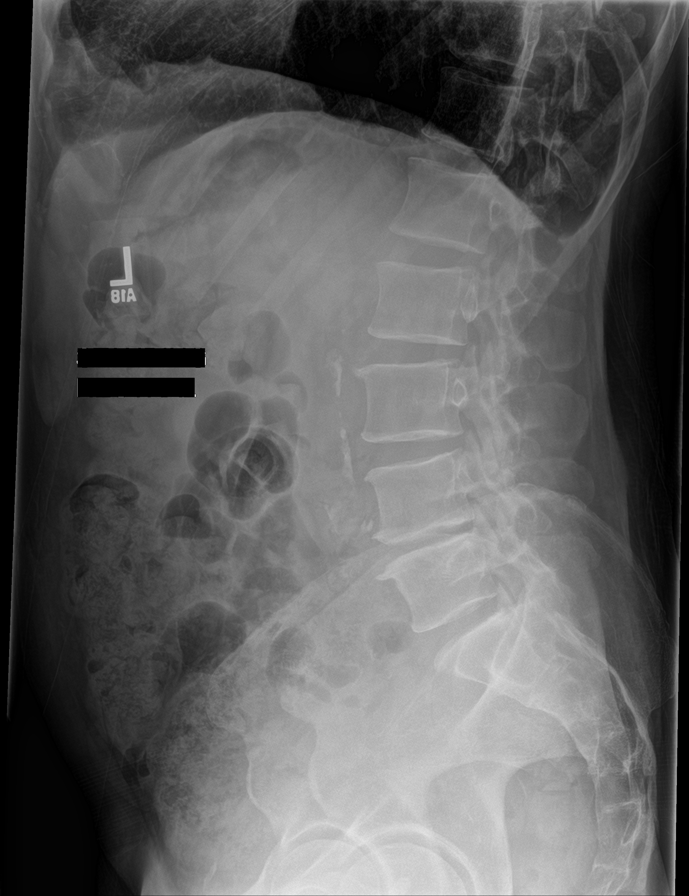
[im 5/7]
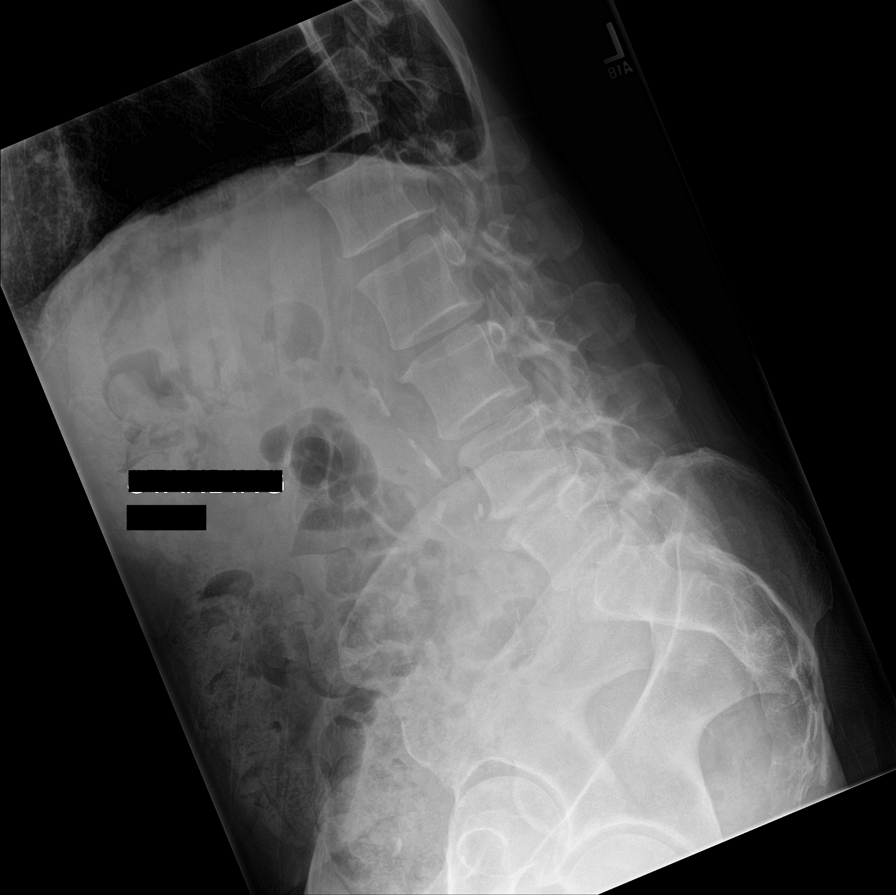
[im 6/7]
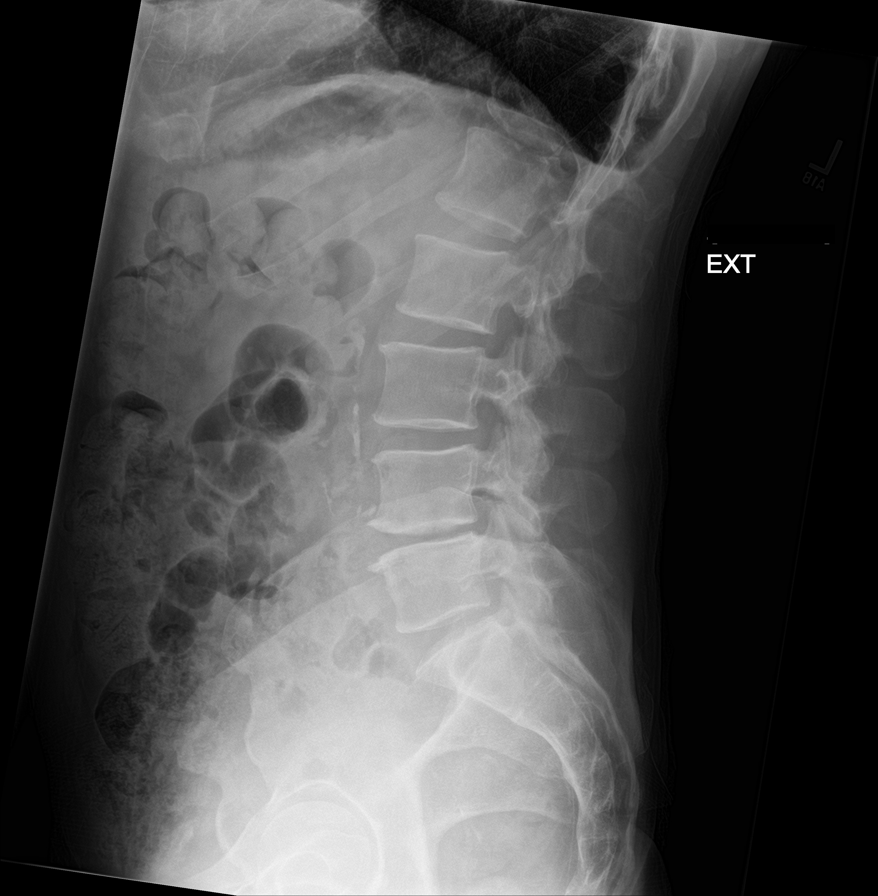
[im 7/7]
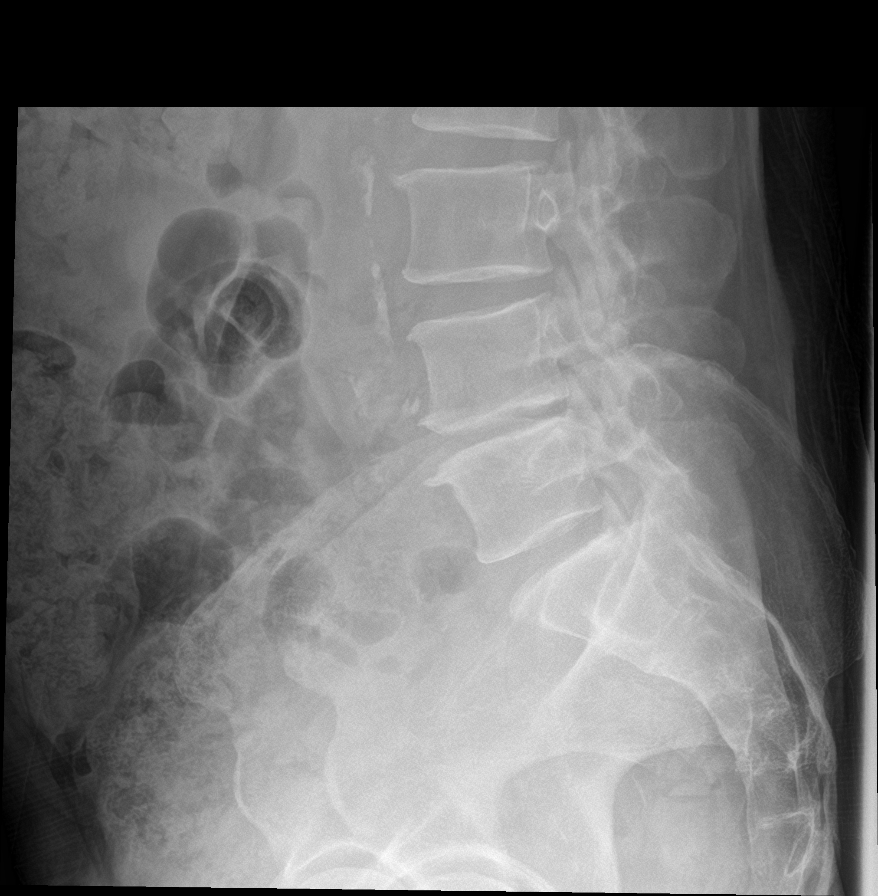

[7 of 7 positions shown; findings below may reference images not displayed]

FINDINGS: There are 5 non rib-bearing lumbar type vertebral bodies.

Normal alignment of lumbar spine. No anterolisthesis or
retrolisthesis. No anterolisthesis or retrolisthesis is elicited
given the acquired degrees of flexion and extension.

Lumbar vertebral body heights appear preserved.

Mild to moderate multilevel lumbar spine DDD, worse at L4-L5 with
disc space height loss, endplate irregularity and sclerosis.

Limited visualization of the bilateral SI joints is normal

Calcified atherosclerotic plaque within the abdominal aorta. Large
colonic stool burden without evidence of enteric obstruction.
IMPRESSION: 1. Mild to moderate multilevel lumbar spine DDD, worse at L4-L5.
2. No evidence of dynamic lumbar spine instability given the
acquired degrees of flexion and extension.
3.  Aortic Atherosclerosis ([id number]).

## 2023-02-05 DEATH — deceased
